# Patient Record
Sex: Female | Born: 1965 | Race: White | Hispanic: No | Smoking: Never smoker
Health system: Southern US, Community
[De-identification: ages and names within clinical notes are randomized; demographics above are authoritative.]

## PROBLEM LIST (undated history)

## (undated) DIAGNOSIS — F419 Anxiety disorder, unspecified: Secondary | ICD-10-CM

## (undated) DIAGNOSIS — R112 Nausea with vomiting, unspecified: Secondary | ICD-10-CM

## (undated) DIAGNOSIS — K219 Gastro-esophageal reflux disease without esophagitis: Secondary | ICD-10-CM

## (undated) DIAGNOSIS — Z9889 Other specified postprocedural states: Secondary | ICD-10-CM

## (undated) DIAGNOSIS — R32 Unspecified urinary incontinence: Secondary | ICD-10-CM

## (undated) DIAGNOSIS — F988 Other specified behavioral and emotional disorders with onset usually occurring in childhood and adolescence: Secondary | ICD-10-CM

## (undated) DIAGNOSIS — M1990 Unspecified osteoarthritis, unspecified site: Secondary | ICD-10-CM

## (undated) DIAGNOSIS — B009 Herpesviral infection, unspecified: Secondary | ICD-10-CM

## (undated) DIAGNOSIS — G4730 Sleep apnea, unspecified: Secondary | ICD-10-CM

## (undated) DIAGNOSIS — IMO0002 Reserved for concepts with insufficient information to code with codable children: Secondary | ICD-10-CM

## (undated) DIAGNOSIS — R51 Headache: Secondary | ICD-10-CM

## (undated) DIAGNOSIS — J302 Other seasonal allergic rhinitis: Secondary | ICD-10-CM

## (undated) DIAGNOSIS — I499 Cardiac arrhythmia, unspecified: Secondary | ICD-10-CM

## (undated) HISTORY — DX: Herpesviral infection, unspecified: B00.9

## (undated) HISTORY — PX: BACK SURGERY: SHX140

## (undated) HISTORY — DX: Unspecified urinary incontinence: R32

## (undated) HISTORY — PX: BREAST SURGERY: SHX581

## (undated) HISTORY — DX: Other specified behavioral and emotional disorders with onset usually occurring in childhood and adolescence: F98.8

## (undated) HISTORY — PX: WISDOM TOOTH EXTRACTION: SHX21

## (undated) HISTORY — DX: Reserved for concepts with insufficient information to code with codable children: IMO0002

## (undated) HISTORY — DX: Anxiety disorder, unspecified: F41.9

## (undated) HISTORY — PX: CERVICAL FUSION: SHX112

---

## 1997-07-24 ENCOUNTER — Other Ambulatory Visit: Admission: RE | Admit: 1997-07-24 | Discharge: 1997-07-24 | Payer: Self-pay | Admitting: Obstetrics and Gynecology

## 1998-01-15 ENCOUNTER — Inpatient Hospital Stay (HOSPITAL_COMMUNITY): Admission: RE | Admit: 1998-01-15 | Discharge: 1998-01-17 | Payer: Self-pay | Admitting: Obstetrics and Gynecology

## 1998-01-20 ENCOUNTER — Inpatient Hospital Stay (HOSPITAL_COMMUNITY): Admission: AD | Admit: 1998-01-20 | Discharge: 1998-01-20 | Payer: Self-pay | Admitting: Obstetrics and Gynecology

## 1998-02-05 ENCOUNTER — Encounter: Payer: Self-pay | Admitting: Obstetrics and Gynecology

## 1998-02-05 ENCOUNTER — Inpatient Hospital Stay (HOSPITAL_COMMUNITY): Admission: AD | Admit: 1998-02-05 | Discharge: 1998-02-05 | Payer: Self-pay | Admitting: Obstetrics and Gynecology

## 1999-03-01 HISTORY — PX: ABDOMINAL HYSTERECTOMY: SHX81

## 1999-12-31 ENCOUNTER — Other Ambulatory Visit: Admission: RE | Admit: 1999-12-31 | Discharge: 1999-12-31 | Payer: Self-pay | Admitting: Obstetrics and Gynecology

## 2000-02-29 HISTORY — PX: AUGMENTATION MAMMAPLASTY: SUR837

## 2001-02-06 ENCOUNTER — Ambulatory Visit (HOSPITAL_COMMUNITY): Admission: RE | Admit: 2001-02-06 | Discharge: 2001-02-06 | Payer: Self-pay | Admitting: Neurosurgery

## 2001-02-06 ENCOUNTER — Encounter: Payer: Self-pay | Admitting: Neurosurgery

## 2001-03-30 ENCOUNTER — Encounter: Payer: Self-pay | Admitting: Neurosurgery

## 2001-04-03 ENCOUNTER — Inpatient Hospital Stay (HOSPITAL_COMMUNITY): Admission: RE | Admit: 2001-04-03 | Discharge: 2001-04-04 | Payer: Self-pay | Admitting: Neurosurgery

## 2001-04-03 ENCOUNTER — Encounter: Payer: Self-pay | Admitting: Neurosurgery

## 2001-11-08 ENCOUNTER — Other Ambulatory Visit: Admission: RE | Admit: 2001-11-08 | Discharge: 2001-11-08 | Payer: Self-pay | Admitting: Obstetrics and Gynecology

## 2002-03-31 HISTORY — PX: INCONTINENCE SURGERY: SHX676

## 2002-12-26 ENCOUNTER — Ambulatory Visit (HOSPITAL_COMMUNITY): Admission: RE | Admit: 2002-12-26 | Discharge: 2002-12-26 | Payer: Self-pay | Admitting: Obstetrics and Gynecology

## 2004-04-26 ENCOUNTER — Encounter: Admission: RE | Admit: 2004-04-26 | Discharge: 2004-04-26 | Payer: Self-pay | Admitting: Gastroenterology

## 2004-08-20 ENCOUNTER — Other Ambulatory Visit: Admission: RE | Admit: 2004-08-20 | Discharge: 2004-08-20 | Payer: Self-pay | Admitting: Obstetrics and Gynecology

## 2006-01-25 ENCOUNTER — Encounter: Admission: RE | Admit: 2006-01-25 | Discharge: 2006-02-23 | Payer: Self-pay | Admitting: Obstetrics and Gynecology

## 2006-02-24 ENCOUNTER — Encounter: Admission: RE | Admit: 2006-02-24 | Discharge: 2006-03-26 | Payer: Self-pay | Admitting: Obstetrics and Gynecology

## 2006-03-27 ENCOUNTER — Encounter: Admission: RE | Admit: 2006-03-27 | Discharge: 2006-04-26 | Payer: Self-pay | Admitting: Obstetrics and Gynecology

## 2006-04-27 ENCOUNTER — Encounter: Admission: RE | Admit: 2006-04-27 | Discharge: 2006-04-28 | Payer: Self-pay | Admitting: Obstetrics and Gynecology

## 2008-12-29 ENCOUNTER — Emergency Department (HOSPITAL_COMMUNITY): Admission: EM | Admit: 2008-12-29 | Discharge: 2008-12-29 | Payer: Self-pay | Admitting: Emergency Medicine

## 2010-07-16 NOTE — Op Note (Signed)
NAMECATERIN, TABARES                             ACCOUNT NO.:  [id number]   MEDICAL RECORD NO.:  [id number]                   PATIENT TYPE:  AMB   LOCATION:  SDC                                  FACILITY:  [facility]   PHYSICIAN:  Miguel Aschoff, M.D.                    DATE OF BIRTH:  Oct 01, 1965   DATE OF PROCEDURE:  12/26/2002  DATE OF DISCHARGE:                                 OPERATIVE REPORT   PREOPERATIVE DIAGNOSIS:  Vaginal wall masses.   POSTOPERATIVE DIAGNOSIS:  Vaginal wall masses.   PROCEDURE:  Excision of vaginal wall mass of anterior and posterior vaginal  wall.   SURGEON:  Miguel Aschoff, M.D.   ANESTHESIA:  IV sedation with local block.   COMPLICATIONS:  None.   JUSTIFICATION:  The patient is a 45 year old white female who underwent  prior vaginal hysterectomy.  Following vaginal hysterectomy the patient  underwent a suburethral sling procedure and then developed a polypoid lesion  on the anterior ___________ wall in the area of the prior sling procedure.  The patient has discomfort from this area and would like it excised.  In  addition, she has the small lesion on her vulva that she would additionally  like excised.  She presents now to undergo excision of these areas.   DESCRIPTION OF PROCEDURE:  The patient was taken to the operating room and  placed in the supine position.  IV sedation was administered.  She was then  placed in the dorsal lithotomy position and prepped and draped in the usual  sterile fashion.  A speculum was placed in the vaginal vault.  On the  anterior vaginal wall was a 3 cm polypoid area of vaginal mucosa.  This area  was grasped with an Allis clamp, the base of this area injected with 1%  Xylocaine.  Then this area was excised off the anterior vaginal wall and the  defect closed using interrupted sutures of 4-0 Vicryl.  A similar-type  lesion was noted at the area of the posterior vaginal wall near the apex of  the vaginal cuff.  This area was  injected with local anesthetic and again  this additional area was excised without difficulty and this area closed  using running interlocking 4-0 Vicryl.  A third small area was noted on the  fourchette.  This appeared to be a small papilloma.  This was excised  without difficulty and did not require any suturing.  At this point the  procedure was completed, hemostasis was readily achieved, and the patient  was reversed from her anesthesia and taken to the recovery room in  satisfactory condition.  Estimated blood loss was less than 5 mL.  The plan  is for the patient to be discharged home.  Medications for home include  Darvocet-N 100 one every four hours as needed for pain.  She has been  instructed to place nothing in the vagina for two weeks, to call for any  problems such as fever, pain, or heavy bleeding.  She will be seen back in  four weeks for a follow-up examination.                                               Miguel Aschoff, M.D.    AR/MEDQ  D:  12/26/2002  T:  12/26/2002  Job:  [id number]

## 2010-07-16 NOTE — Op Note (Signed)
[place]. [facility]  Patient:    SELINE, ENZOR Visit Number: [id number] MRN: [id number]          Service Type: SUR Location: 3000 [id number] [id number] Attending Physician:  Emeterio Reeve Dictated by:   Payton Doughty, M.D. Proc. Date: 04/03/01 Admit Date:  04/03/2001 Discharge Date: 04/04/2001                             Operative Report  PREOPERATIVE DIAGNOSIS:  Herniated disk at C5-6.  POSTOPERATIVE DIAGNOSIS:  Herniated disk at C5-6.  OPERATION PERFORMED:  C5-6 anterior cervical diskectomy and fusion and tether plate.  SURGEON:  Payton Doughty, M.D.  ANESTHESIA:  General endotracheal.  PREP:  Sterile Betadine prep and scrub with alcohol wipe.  COMPLICATIONS:  None.  ASSISTANT: 1. Hewitt Shorts, M.D. 2. [place].  DESCRIPTION OF PROCEDURE:  The patient is a 45 year old right-handed white female with a right C6-7 radiculopathy and herniated disk at C5-6.  The patient was taken to the operating room, smoothly anesthetized and intubated, and placed supine on the operating table in the halter head traction. Following shave, prep and drape in the usual sterile fashion, the skin was incised in the midline, the medial border of the sternocleidomastoid at the level of the platysma.  The platysma was identified, elevated, divided and undermined.  Sternocleidomastoid was identified, medial dissection revealed the carotid artery retracted laterally to the left, trachea and esophagus retracted laterally to the right exposing the bones of the anterior cervical spine.  A marker was placed.  Intraoperative x-ray was obtained to confirm correctness of the level.  Having confirmed correctness of the level, diskectomy was carried out at C6-7 first under gross observation.  The operating microscope was then brought in and microdissection technique used to explore the anterior epidural space, resect the disk and posterior longitudinal ligament.  Both neural foramina  were carefully decompressed and it was found there was disk extruded into the right C6 neural foramen.  There was also some lateral compression in the anterior epidural space.  Following complete decompression, a 7 mm bone graft was fashioned from patellar allograft and gently tapped into the disk space.  A 14 mm tether plate was then placed with 12 mm screws, two at C5, two in C6.  The wound was irrigated and hemostasis assured.  X-ray confirmed good placement of bone graft with plate and screws.  Platysma was reapproximated with 3-0 Vicryl in interrupted fashion.  Subcutaneous tissues reapproximated with 3-0 Vicryl suture in interrupted fashion.  The skin was closed with 4-0 Vicryl in running subcuticular fashion.  Benzoin and Steri-Strips were placed and made occlusive with Telfa and OpSite.  The patient was then placed in an Aspen collar and returned to the recovery room in good condition. Dictated by:   Payton Doughty, M.D. Attending Physician:  Emeterio Reeve DD:  04/03/01 TD:  04/04/01 Job: [id number]

## 2010-07-16 NOTE — H&P (Signed)
[place]. [facility]  Patient:    Kaitlyn Carter, Kaitlyn Carter Visit Number: [id number] MRN: [id number]          Service Type: SUR Location: 3000 3038 01 Attending Physician:  Emeterio Reeve Dictated by:   Payton Doughty, M.D. Admit Date:  04/03/2001 Discharge Date: 04/04/2001                           History and Physical  ADMITTING DIAGNOSIS:  Cervical herniated disk at C5-C6 on the right side.  SERVICE:  Neurosurgery.  HISTORY OF PRESENT ILLNESS:  This is a 45 year old right-handed white female who has had some difficulty with her elbow, got an injection a year ago, did well, had recurrence of her pain, pain down her shoulder and neck, now recurrence of ______ radiating from the forearm to the first two fingers of the right hand.  Another injection of her elbow has not done much for it.  MRI demonstrated a herniated disk at 5-6 and she was referred to me.  PAST MEDICAL HISTORY:  Her medical history is remarkable for depression.  MEDICATIONS: 1. Wellbutrin 100 mg twice a day. 2. Zyrtec once a day for allergies. 3. Atenolol half a tablet, which is 50 mg a day. 4. Prednisone Dosepak, which is now over. 5. She uses Darvocet and Ultracet on a p.r.n. basis.  PAST SURGICAL HISTORY:  Surgical history is remarkable for hysterectomy, cosmetic operation, wisdom teeth and a D&C.  ALLERGIES:  She is allergic to VICODIN.  SOCIAL HISTORY:  She does not smoke, drinks only socially and stays at home.  FAMILY HISTORY:  Mom is 72, in good health with neck and back pain.  Her daddy is 30, in fair health with type 2 diabetes.  REVIEW OF SYSTEMS:  Remarkable for night sweats, chest pain, irregular pulse, leg pain, shortness of breath, UTIs, arm weakness and arm pain, neck pain, joint pain, blacking out, coordination, depression and nasal allergies.  PHYSICAL EXAMINATION:  HEENT:  Within normal limits.  NECK:  She has reasonable range of motion of her neck.  Turning her  head towards the right can reproduce her right shoulder pain.  CHEST:  Clear.  CARDIAC:  Regular rate and rhythm.  She is somewhat tachycardic.  ABDOMEN:  Nontender.  No hepatosplenomegaly.  EXTREMITIES:  Without clubbing or cyanosis.  GU:  Deferred.  PERIPHERAL PULSES:  Good.  NEUROLOGIC:  She is awake, alert and oriented.  Cranial nerves are intact. Motor exam shows 5/5 strength throughout the upper extremities, save for the right triceps, which is 4+/5.  Sensory deficit as described in her right C6 and partial C7.  Reflexes are 2 at the left biceps, 1 at the right biceps, 2 at the triceps, 2 at the brachioradialis bilaterally.  Lower extremities are non-myelopathic.  LABORATORY AND ACCESSORY DATA:  MRI shows loss of cervical lordosis at C5-6 as well as a herniated disk at that level to the right with interference of the right side of the cord and the right C5-6 neural foramen.  CLINICAL IMPRESSION:  Right C6-C7 radiculopathy related to a herniated disk at C5-C6.  She underwent epidural steroids to no avail and now presents for an anterior cervical diskectomy and fusion.  The risks and benefits of this approach have been discussed with her and she wishes to proceed. Dictated by:   Payton Doughty, M.D. Attending Physician:  Emeterio Reeve DD:  04/03/01 TD:  04/03/01 Job: [id number]  [id number]

## 2012-06-28 ENCOUNTER — Ambulatory Visit: Payer: Self-pay | Admitting: Internal Medicine

## 2012-07-03 ENCOUNTER — Telehealth: Payer: Self-pay | Admitting: Internal Medicine

## 2012-07-03 ENCOUNTER — Ambulatory Visit (INDEPENDENT_AMBULATORY_CARE_PROVIDER_SITE_OTHER): Payer: Self-pay | Admitting: Internal Medicine

## 2012-07-03 ENCOUNTER — Encounter: Payer: Self-pay | Admitting: Internal Medicine

## 2012-07-03 VITALS — BP 138/86 | HR 114 | Temp 97.5°F | Resp 18 | Ht 66.0 in | Wt 151.0 lb

## 2012-07-03 DIAGNOSIS — J309 Allergic rhinitis, unspecified: Secondary | ICD-10-CM

## 2012-07-03 DIAGNOSIS — R32 Unspecified urinary incontinence: Secondary | ICD-10-CM

## 2012-07-03 DIAGNOSIS — F4323 Adjustment disorder with mixed anxiety and depressed mood: Secondary | ICD-10-CM

## 2012-07-03 DIAGNOSIS — F329 Major depressive disorder, single episode, unspecified: Secondary | ICD-10-CM

## 2012-07-03 DIAGNOSIS — F32A Depression, unspecified: Secondary | ICD-10-CM

## 2012-07-03 DIAGNOSIS — J45909 Unspecified asthma, uncomplicated: Secondary | ICD-10-CM

## 2012-07-03 DIAGNOSIS — Z90710 Acquired absence of both cervix and uterus: Secondary | ICD-10-CM

## 2012-07-03 MED ORDER — NYSTATIN-TRIAMCINOLONE 100000-0.1 UNIT/GM-% EX CREA: TOPICAL_CREAM | Freq: Two times a day (BID) | CUTANEOUS | Status: DC

## 2012-07-03 MED ORDER — MOMETASONE FURO-FORMOTEROL FUM 100-5 MCG/ACT IN AERO: INHALATION_SPRAY | RESPIRATORY_TRACT | Status: DC

## 2012-07-03 MED ORDER — FLUCONAZOLE 150 MG PO TABS: 150.0000 mg | ORAL_TABLET | Freq: Once | ORAL | Status: DC

## 2012-07-03 MED ORDER — SERTRALINE HCL 50 MG PO TABS: ORAL_TABLET | ORAL | Status: DC

## 2012-07-03 NOTE — Patient Instructions (Addendum)
Use Dulera 2 inhalations twice a day  Take diflucan pill and use creme twice a day externally  [place] allergy  Dr. Unity Callas or Dr. Barnetta Chapel.  Will set up referral to Dr. Edward Jolly  urogynecologist  See me in 7-10 days

## 2012-07-03 NOTE — Progress Notes (Signed)
Subjective:    Patient ID: Kaitlyn Carter, female    DOB: [birth year], 47 y.o.   MRN: [id number]  HPI  Kaitlyn Carter is a new pt here to establish care.   She is the daughter of Arlys John a pt of mine.    Former care at [facility].  PMH of anxiety/depression, urinary incontinence S/P urethral sling, and S/p Hysterectomy  She is concerned about several issues.  Since her mesh sling surgery (Dr. Pete Glatter 2004) she is still having problems with stress and at times urge incontinence. Of note she also states that her husband reports that during intercourse he "Feels like he is hitting something" and pt states he does have irritation on foreskin after intercourse.   She would like to discuss with urogynecologist.    She also has lots of situational stress right now.   I note she is on low dose Zoloft 50 mg.  She tells me her husband was emotionally unfaithful but he was not involved in a physical relationship.   She has had church counseling but has significant trust issues with her spouse.  She does not have a therapist as yet.  She also has been treated for bronchits with Levofloxacin and two courses of prednisone.   She has had lots of problems with allergies this year,  She was wheezing and SOB.  She did see an ENT Dr. Ezzard Standing who did not find active infection.   She did have a CXR which by her report was negative  (done at [facility] - I so not have report)  She stopped both Levofloxacin and Prednisone yesterday.  She states she is about 50-60% improved.  Cough much better , no SOB now , no wheezing.  She has never been allergy tested but she does feel itchy after being with horses at her home and certain food cause her uvula to swell per her report.    Pt feels like she has a yeast infection after antibiotics and prednisone.  Lots of external itching and red skin  Review of Systems    see HPI Objective:   Physical Exam  Physical Exam  Nursing note and vitals reviewed.   Peak  flow in office today  350 Constitutional: She is oriented to person, place, and time. She appears well-developed and well-nourished.  HENT:  Head: Normocephalic and atraumatic.  Cardiovascular: Normal rate and regular rhythm. Exam reveals no gallop and no friction rub.  No murmur heard.  Pulmonary/Chest: Breath sounds normal. She has no wheezes. She has no rales.   Good air flow  Neurological: She is alert and oriented to person, place, and time.  Skin: Skin is warm and dry.  Psychiatric: She has a normal mood and affect. Her behavior is normal.   Tearful when discussing marital issues       Assessment & Plan:  Urinary incontinence/  S/P mesh sling.  She would like an opinion of a urogynecologist .  Will refer to Dr. Edward Jolly. ??? Mesh erosion  Probable asthmatic bronchitis  Peak flow very good today.  Will change her Flovent to [facility] to take 2 inhalations bid for the next two months.  She is off prednisone now.  Will get old records.  She can use albuterol prn.  Will get labs today  Allergic rhinitis  Based on history there is likely strong external allergy component  I gave pt number for [place] allergy to be evaluated.  Situational anxiety/depression  Marital discord  .  Will refer  her for marital therapy.  Ok to try to increase Zoloft to 100 mg nightly but strong situational component to her current emotional condition.   Probable post antibiotic/prednisone yeast vaginitis  OK for empiric Diflucan and mycology externally for now  She is to see me in one week

## 2012-07-03 NOTE — Telephone Encounter (Signed)
Pt scheduled with Dr Wyvonnia Lora Friday Jul 13, 2012 at 9:15 am.  Pt made aware of appt, date, time, & location.Brayton Caves

## 2012-07-04 LAB — LIPID PANEL
Cholesterol: 174 mg/dL (ref 0–200)
HDL: 70 mg/dL (ref >39)
Total CHOL/HDL Ratio: 2.5 Ratio
VLDL: 33 mg/dL (ref 0–40)

## 2012-07-04 LAB — COMPREHENSIVE METABOLIC PANEL
ALT: 13 U/L (ref 0–35)
Alkaline Phosphatase: 122 U/L — ABNORMAL HIGH (ref 39–117)
Creat: 0.73 mg/dL (ref 0.50–1.10)
Sodium: 140 mEq/L (ref 135–145)
Total Bilirubin: 0.6 mg/dL (ref 0.3–1.2)
Total Protein: 7.7 g/dL (ref 6.0–8.3)

## 2012-07-04 LAB — CBC WITH DIFFERENTIAL/PLATELET
Basophils Relative: 0 % (ref 0–1)
Eosinophils Absolute: 0.2 10*3/uL (ref 0.0–0.7)
Eosinophils Relative: 1 % (ref 0–5)
MCH: 30.2 pg (ref 26.0–34.0)
MCHC: 33.5 g/dL (ref 30.0–36.0)
MCV: 90.1 fL (ref 78.0–100.0)
Neutrophils Relative %: 62 % (ref 43–77)
Platelets: 345 10*3/uL (ref 150–400)
RDW: 14.4 % (ref 11.5–15.5)

## 2012-07-04 LAB — TSH: TSH: 2.152 u[IU]/mL (ref 0.350–4.500)

## 2012-07-10 ENCOUNTER — Telehealth: Payer: Self-pay | Admitting: *Deleted

## 2012-07-10 ENCOUNTER — Ambulatory Visit: Payer: BC Managed Care – PPO | Admitting: Internal Medicine

## 2012-07-10 NOTE — Telephone Encounter (Signed)
Prior authorization form faxed back for [facility]

## 2012-07-10 NOTE — Telephone Encounter (Signed)
See comments in call comments

## 2012-07-13 ENCOUNTER — Ambulatory Visit (INDEPENDENT_AMBULATORY_CARE_PROVIDER_SITE_OTHER): Payer: BC Managed Care – PPO | Admitting: Obstetrics and Gynecology

## 2012-07-13 ENCOUNTER — Encounter: Payer: Self-pay | Admitting: Obstetrics and Gynecology

## 2012-07-13 VITALS — BP 120/78 | Ht 65.0 in | Wt 154.0 lb

## 2012-07-13 DIAGNOSIS — N952 Postmenopausal atrophic vaginitis: Secondary | ICD-10-CM

## 2012-07-13 DIAGNOSIS — Z01419 Encounter for gynecological examination (general) (routine) without abnormal findings: Secondary | ICD-10-CM

## 2012-07-13 DIAGNOSIS — N5312 Painful ejaculation: Secondary | ICD-10-CM

## 2012-07-13 DIAGNOSIS — N393 Stress incontinence (female) (male): Secondary | ICD-10-CM

## 2012-07-13 DIAGNOSIS — R1032 Left lower quadrant pain: Secondary | ICD-10-CM

## 2012-07-13 MED ORDER — ESTRADIOL 0.1 MG/GM VA CREA: TOPICAL_CREAM | VAGINAL | Status: DC

## 2012-07-13 NOTE — Patient Instructions (Addendum)
Conjugated Estrogens vaginal cream What is this medicine? CONJUGATED ESTROGENS (CON ju gate ed ESS troe jenz) are a mixture of female hormones. This cream can help relieve symptoms associated with menopause.like vaginal dryness and irritation. This medicine may be used for other purposes; ask your health care provider or pharmacist if you have questions. What should I tell my health care provider before I take this medicine? They need to know if you have any of these conditions: -abnormal vaginal bleeding -blood vessel disease or blood clots -breast, cervical, endometrial, or uterine cancer -dementia -diabetes -gallbladder disease -heart disease or recent heart attack -high blood pressure -high cholesterol -high level of calcium in the blood -hysterectomy -kidney disease -liver disease -migraine headaches -protein C deficiency -protein S deficiency -stroke -systemic lupus erythematosus (SLE) -tobacco smoker -an unusual or allergic reaction to estrogens other medicines, foods, dyes, or preservatives -pregnant or trying to get pregnant -breast-feeding How should I use this medicine? This medicine is for use in the vagina only. Do not take by mouth. Follow the directions on the prescription label. Use at bedtime unless otherwise directed by your doctor or health care professional. Use the special applicator supplied with the cream. Wash hands before and after use. Fill the applicator with the cream and remove from the tube. Lie on your back, part and bend your knees. Insert the applicator into the vagina and push the plunger to expel the cream into the vagina. Wash the applicator with warm soapy water and rinse well. Use exactly as directed for the complete length of time prescribed. Do not stop using except on the advice of your doctor or health care professional. Talk to your pediatrician regarding the use of this medicine in children. Special care may be needed. A patient package  insert for the product will be given with each prescription and refill. Read this sheet carefully each time. The sheet may change frequently. Overdosage: If you think you have taken too much of this medicine contact a poison control center or emergency room at once. NOTE: This medicine is only for you. Do not share this medicine with others. What if I miss a dose? If you miss a dose, use it as soon as you can. If it is almost time for your next dose, use only that dose. Do not use double or extra doses. What may interact with this medicine? Do not take this medicine with any of the following medications: -aromatase inhibitors like aminoglutethimide, anastrozole, exemestane, letrozole, testolactone This medicine may also interact with the following medications: -barbiturates used for inducing sleep or treating seizures -carbamazepine -grapefruit juice -medicines for fungal infections like itraconazole and ketoconazole -raloxifene or tamoxifen -rifabutin -rifampin -rifapentine -ritonavir -some antibiotics used to treat infections -St. John's Wort -warfarin This list may not describe all possible interactions. Give your health care provider a list of all the medicines, herbs, non-prescription drugs, or dietary supplements you use. Also tell them if you smoke, drink alcohol, or use illegal drugs. Some items may interact with your medicine. What should I watch for while using this medicine? Visit your health care professional for regular checks on your progress. You will need a regular breast and pelvic exam. You should also discuss the need for regular mammograms with your health care professional, and follow his or her guidelines. This medicine can make your body retain fluid, making your fingers, hands, or ankles swell. Your blood pressure can go up. Contact your doctor or health care professional if you feel you are retaining   fluid. If you have any reason to think you are pregnant; stop  taking this medicine at once and contact your doctor or health care professional. Tobacco smoking increases the risk of getting a blood clot or having a stroke, especially if you are more than 45 years old. You are strongly advised not to smoke. If you wear contact lenses and notice visual changes, or if the lenses begin to feel uncomfortable, consult your eye care specialist. If you are going to have elective surgery, you may need to stop taking this medicine beforehand. Consult your health care professional for advice prior to scheduling the surgery. What side effects may I notice from receiving this medicine? Side effects that you should report to your doctor or health care professional as soon as possible: -allergic reactions like skin rash, itching or hives, swelling of the face, lips, or tongue -breast tissue changes or discharge -changes in vision -chest pain -confusion, trouble speaking or understanding -dark urine -general ill feeling or flu-like symptoms -light-colored stools -nausea, vomiting -pain, swelling, warmth in the leg -right upper belly pain -severe headaches -shortness of breath -sudden numbness or weakness of the face, arm or leg -trouble walking, dizziness, loss of balance or coordination -unusual vaginal bleeding -yellowing of the eyes or skin Side effects that usually do not require medical attention (report to your doctor or health care professional if they continue or are bothersome): -hair loss -increased hunger or thirst -increased urination -symptoms of vaginal infection like itching, irritation or unusual discharge -unusually weak or tired This list may not describe all possible side effects. Call your doctor for medical advice about side effects. You may report side effects to FDA at 1-800-FDA-1088. Where should I keep my medicine? Keep out of the reach of children. Store at room temperature between 15 and 30 degrees C (59 and 86 degrees F). Throw away  any unused medicine after the expiration date. NOTE: This sheet is a summary. It may not cover all possible information. If you have questions about this medicine, talk to your doctor, pharmacist, or health care provider.  2012, [employer]. (05/19/2010 9:20:36 AM) 

## 2012-07-13 NOTE — Progress Notes (Signed)
Patient ID: Kaitlyn Carter, female   DOB: 1965/09/17, 47 y.o.   MRN: [id number]  47 y.o.  G53P0  Caucasian  female status post vaginal hysterectomy with anterior and posterior colporrhaphy in 2000 or 2001 by Dr. Miguel Aschoff of [facility], who is now referred by Dr. Constance Goltz for recurrent urinary incontinence.    Status post [facility] sling in 04/08/02 by Dr. Pete Glatter, urologist in [facility]. Status post excision of vaginal mucosal scar tissue removed by Dr. Miguel Aschoff 6 months after sling was performed.  Prior to midurethral sling, patient had urinary incontinence with sneezing, coughing, intercourse.  Patient states she never got relief of her symptoms. Wearing Poise pads.  Did not return to her urologist for further care.  Post operatively, patient has had recurrent UTIs.  Takes Macrobid for prophylaxis with intercourse.  If she does not take Macrobid, has a monthly UTI.     Does not feel like she is emptying completely.  Having urgency and frequency.  DF is every hour.  NF twice night.  No eneursis.  No blood in urine.  No dysuria.  Notes left lower quadrant pain at times.  Husband having penile pain and keloid from intercourse.  This is new onset for the last 10 months.  Husband is long time partner.  Patient now feeling a vaginal bulge.  Worried about the vaginal size being too large.  No history of nephrolithiasis.    Difficulty having bowel movements.  Feels bowel movements are more loose than firm.  Does splinting to have bowel movements.  Has had fecal incontinence related to intercourse.   Had colonoscopy 2004. 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    Currently battling bronchitis symptoms for the last 6 weeks.  Will see an allergy specialist.  Her bronchitis symptoms make her incontinence worse.   No LMP recorded. Patient has had a hysterectomy.          Sexually active: yes  The current method of family planning is status post hysterectomy.    Last mammogram:  2012 in [place]. Last pap smear: does  not remember.   History of abnormal pap: no.  Hormonal therapy:  none No complete GYN exam in 2 years.   Family History  Problem Relation Age of Onset  . Depression Mother   . Asthma Mother   . Diabetes Father   . Hypertension Father   . Heart disease Father   . Kidney disease Father   . Breast cancer Maternal Grandmother   . Hypertension Maternal Grandmother   . Hypertension Maternal Grandfather   . Stroke Maternal Grandfather   . Hypertension Paternal Grandfather   . Stroke Paternal Grandfather     Patient Active Problem List   Diagnosis Date Noted  . Adjustment disorder with mixed anxiety and depressed mood 07/03/2012  . Urinary incontinence 07/03/2012  . Asthmatic bronchitis 07/03/2012  . Allergic rhinitis 07/03/2012  . S/P hysterectomy 07/03/2012    Past Medical History  Diagnosis Date  . Attention deficit disorder   . Anxiety   . Dyspareunia   . Urinary incontinence     Past Surgical History  Procedure Laterality Date  . Incontinence surgery  03/2002    [facility] sling--Dr. Di Kindle in  [facility]  . Abdominal hysterectomy  2001    [facility]-Dr. Miguel Aschoff  . Breast surgery    . Augmentation mammaplasty  2002    Allergies: Review of patient's allergies indicates no known allergies.  Current Outpatient Prescriptions  Medication Sig Dispense Refill  .  albuterol (PROVENTIL HFA;VENTOLIN HFA) 108 (90 BASE) MCG/ACT inhaler Inhale 2 puffs into the lungs every 6 (six) hours as needed for wheezing.      Marland Kitchen amphetamine-dextroamphetamine (ADDERALL) 10 MG tablet Take 10 mg by mouth daily.      . fluticasone (FLOVENT HFA) 220 MCG/ACT inhaler Inhale 1 puff into the lungs 2 (two) times daily.      . mometasone-formoterol (DULERA) 100-5 MCG/ACT AERO Take 2 inhalations twice a day  1 Inhaler  1  . sertraline (ZOLOFT) 50 MG tablet Take 2 tablets daily  60 tablet  1  . fluconazole (DIFLUCAN) 150 MG tablet Take 1 tablet (150 mg total) by mouth once.  1 tablet  0  .  nystatin-triamcinolone (MYCOLOG II) cream Apply topically 2 (two) times daily.  30 g  0   No current facility-administered medications for this visit.    ROS: Pertinent items are noted in HPI.  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  Exam:    BP 120/78  Ht 5\' 5"  (1.651 m)  Wt 154 lb (69.854 kg)  BMI 25.63 kg/m2   Wt Readings from Last 3 Encounters:  07/13/12 154 lb (69.854 kg)  07/03/12 151 lb (68.493 kg)     Ht Readings from Last 3 Encounters:  07/13/12 5\' 5"  (1.651 m)  07/03/12 5\' 6"  (1.676 m)    General appearance: alert, cooperative and appears stated age Abdomen: soft, non-tender; bowel sounds normal; no masses,  no organomegaly  Pelvic: External genitalia:  no lesions              Urethra:  normal appearing urethra with no masses, tenderness or lesions              Bartholins and Skenes: normal                 Vagina: normal appearing vagina with normal color and discharge, no lesions.  There is a minimal cystocele.  There is no visible or directly palpable synthetic sling.  With careful bimanual exam, I can identify the location of the sling which seems to be closer to the bladder and urethrovesical junction than the midurethra.                Cervix: absent                      Bimanual Exam:  Uterus:  Absent.                                      Adnexa: normal adnexa in size, nontender and no masses                                      Rectovaginal: Confirms                                      Anus:  normal sphincter tone, no lesions  Urine dip:  negative  Op report reviewed from [facility] sling - 04/08/02 - Dr. Di Kindle, [facility] - left cystotomy occurred and left needle was corrected.  Left vaginal side of sling exited lateral to the vaginal incision and a flap was created to cover over the sling.  Assessment:  Genuine stress incontinence.  I doubt any fistula formation. Female dyspareunia. Atrophic vagina. LLQ pain. Overdue  for mammogram and preventative GYN exam. Fecal incontinence.   Plan:    Patient will need a comprehensive evaluation of her incontinence including multichannel urodynamic studies and cystoscopy.   She may benefit from a future repeat tension free sling, a pubovaginal sling, or urethral bulking agent therapy depending on the results of her evaluation. As the dyspareunia is recent in nature, I believe it is due to atrophic vaginal changes with perimenopause.  I have therefore prescribed vaginal Estrace cream 1/2 gram nightly for 2 weeks and then twice weekly. If dyspareunia is persistent despite estrogen therapy, the old sling may need excision.  Gave the patient the name of [facility] for her husband to see. [facility] We discussed Metamucil as a way to improve her bowel function including her occasional fecal incontinence. Patient may also benefit from a pelvic ultrasound to evaluate the LLQ pain. Follow up visit in approximately 2 weeks.  An After Visit Summary was printed and given to the patient.

## 2012-07-15 ENCOUNTER — Telehealth: Payer: Self-pay | Admitting: Internal Medicine

## 2012-07-15 ENCOUNTER — Encounter: Payer: Self-pay | Admitting: Internal Medicine

## 2012-07-15 DIAGNOSIS — M5020 Other cervical disc displacement, unspecified cervical region: Secondary | ICD-10-CM | POA: Insufficient documentation

## 2012-07-15 NOTE — Telephone Encounter (Signed)
Bobbie  Call Maely and see if she can come in to see me this week in office.   Give her a 30 min appt.    Message back with date  Thanks

## 2012-07-16 NOTE — Telephone Encounter (Signed)
LVM regarding appt.

## 2012-07-19 ENCOUNTER — Ambulatory Visit (INDEPENDENT_AMBULATORY_CARE_PROVIDER_SITE_OTHER): Payer: BC Managed Care – PPO | Admitting: Internal Medicine

## 2012-07-19 ENCOUNTER — Telehealth: Payer: Self-pay | Admitting: Internal Medicine

## 2012-07-19 ENCOUNTER — Encounter: Payer: Self-pay | Admitting: Internal Medicine

## 2012-07-19 DIAGNOSIS — R0683 Snoring: Secondary | ICD-10-CM

## 2012-07-19 DIAGNOSIS — R0989 Other specified symptoms and signs involving the circulatory and respiratory systems: Secondary | ICD-10-CM

## 2012-07-19 DIAGNOSIS — F4323 Adjustment disorder with mixed anxiety and depressed mood: Secondary | ICD-10-CM

## 2012-07-19 DIAGNOSIS — R32 Unspecified urinary incontinence: Secondary | ICD-10-CM

## 2012-07-19 DIAGNOSIS — J309 Allergic rhinitis, unspecified: Secondary | ICD-10-CM

## 2012-07-19 DIAGNOSIS — J45909 Unspecified asthma, uncomplicated: Secondary | ICD-10-CM

## 2012-07-19 LAB — COMPREHENSIVE METABOLIC PANEL
ALT: 19 U/L (ref 0–35)
AST: 20 U/L (ref 0–37)
Chloride: 104 mEq/L (ref 96–112)
Creat: 0.83 mg/dL (ref 0.50–1.10)
Sodium: 140 mEq/L (ref 135–145)
Total Bilirubin: 0.3 mg/dL (ref 0.3–1.2)
Total Protein: 7.2 g/dL (ref 6.0–8.3)

## 2012-07-19 NOTE — Patient Instructions (Addendum)
Vonna Kotyk:  [phone]  Jeannie Done:   [phone]

## 2012-07-19 NOTE — Telephone Encounter (Signed)
Pt scheduled with Dr Vassie Loll on Tuesday June 10,2014 at 3:15 pm.  Pt made aware of appt, date, time, & location by phone.Kaitlyn Carter

## 2012-07-19 NOTE — Progress Notes (Signed)
Subjective:    Patient ID: Kaitlyn Carter, female    DOB: [birth year], 47 y.o.   MRN: [id number]  HPI Kaitlyn Carter is here for follow up.    She reports mood is better on Zoloft 100 mg.  Less anxious and crying is less  She has seen Dr. Edward Jolly and is in work up for stress incontinence  See labs calcium slightly elevated.   She reports today that she is snoring frequently and family member states pt "stops breathing" at night.  She has not needed rescue inhaler for wheezing.  Insurance did not cover her Dulera . She has not been using Flovent regularly  See pulse rate  Pt states she has always had a rapid heart rate .  NO SOB or dizziness  Allergies  Allergen Reactions  . Vicodin (Hydrocodone-Acetaminophen)    Past Medical History  Diagnosis Date  . Attention deficit disorder   . Anxiety   . Dyspareunia   . Urinary incontinence    Past Surgical History  Procedure Laterality Date  . Incontinence surgery  03/2002    [facility] sling--Dr. Di Kindle in  [facility]  . Abdominal hysterectomy  2001    [facility]-Dr. Miguel Aschoff  . Breast surgery    . Augmentation mammaplasty  2002   History   Social History  . Marital Status: Single    Spouse Name: N/A    Number of Children: N/A  . Years of Education: N/A   Occupational History  . Not on file.   Social History Main Topics  . Smoking status: Never Smoker   . Smokeless tobacco: Never Used  . Alcohol Use: Yes     Comment: socially  . Drug Use: No  . Sexually Active: Yes    Birth Control/ Protection: Surgical     Comment: [facility] 2001   Other Topics Concern  . Not on file   Social History Narrative  . No narrative on file   Family History  Problem Relation Age of Onset  . Depression Mother   . Asthma Mother   . Diabetes Father   . Hypertension Father   . Heart disease Father   . Kidney disease Father   . Breast cancer Maternal Grandmother   . Hypertension Maternal Grandmother   . Hypertension Maternal Grandfather   . Stroke  Maternal Grandfather   . Hypertension Paternal Grandfather   . Stroke Paternal Grandfather    Patient Active Problem List   Diagnosis Date Noted  . Snoring 07/19/2012  . Hypercalcemia 07/19/2012  . 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 Adjustment disorder with mixed anxiety and depressed mood 07/03/2012  . Urinary incontinence 07/03/2012  . Asthmatic bronchitis 07/03/2012  . Allergic rhinitis 07/03/2012  . S/P hysterectomy 07/03/2012   Current Outpatient Prescriptions on File Prior to Visit  Medication Sig Dispense Refill  . albuterol (PROVENTIL HFA;VENTOLIN HFA) 108 (90 BASE) MCG/ACT inhaler Inhale 2 puffs into the lungs every 6 (six) hours as needed for wheezing.      Marland Kitchen estradiol (ESTRACE) 0.1 MG/GM vaginal cream Use 1/2 g vaginally every night for the first 2 weeks, then use 1/2 g vaginally two or three times per week as needed to maintain symptom relief.  42.5 g  0  . fluticasone (FLOVENT HFA) 220 MCG/ACT inhaler Inhale 1 puff into the lungs 2 (two) times daily.      . sertraline (ZOLOFT) 50 MG tablet Take 2 tablets daily  60 tablet  1  . amphetamine-dextroamphetamine (ADDERALL) 10 MG tablet  Take 10 mg by mouth daily.       No current facility-administered medications on file prior to visit.       Review of Systems See HPI    Objective:   Physical Exam Physical Exam  Nursing note and vitals reviewed.  Peak flow  340 in office Constitutional: She is oriented to person, place, and time. She appears well-developed and well-nourished.  HENT:  Head: Normocephalic and atraumatic.  Cardiovascular: She is tacycardic rate 114-120 and regular rhythm. Exam reveals no gallop and no friction rub.  No murmur heard.  Pulmonary/Chest: Breath sounds normal. She has no wheezes. She has no rales.  Neurological: She is alert and oriented to person, place, and time.  Skin: Skin is warm and dry.  Psychiatric: She has a normal mood and affect. Her behavior is normal.              Assessment & Plan:  Asthmatic bronchitis   She is off prednisone now Advised to use flovent bid for 4 weeks and I will recheck her in office then and evaluate for discontinuance.  Peak flow good today  Adjustment disorder:  willl continue zoloft 100 mg daily.  I gave phone number to Vonna Kotyk and Jeannie Done and pt is to call one for counseling.    Snoring  ?? OSA  Will refer to pulmonology for sleep study   Tachycardia  Etiology unclear now.  She tells me she is off her Adderall for the past several days.  THS normal    Minimal elevation calcium will rehceck today with PTH  Urinary incontinence  Under evaluation  Dr. Edward Jolly  See me in 4 weeks

## 2012-07-20 LAB — CBC WITH DIFFERENTIAL/PLATELET
Basophils Absolute: 0 10*3/uL (ref 0.0–0.1)
Eosinophils Absolute: 0.1 10*3/uL (ref 0.0–0.7)
Eosinophils Relative: 1 % (ref 0–5)
Lymphocytes Relative: 29 % (ref 12–46)
MCV: 88.5 fL (ref 78.0–100.0)
Neutrophils Relative %: 63 % (ref 43–77)
Platelets: 358 10*3/uL (ref 150–400)
RDW: 14.9 % (ref 11.5–15.5)
WBC: 10.5 10*3/uL (ref 4.0–10.5)

## 2012-07-24 ENCOUNTER — Encounter: Payer: Self-pay | Admitting: *Deleted

## 2012-07-25 ENCOUNTER — Telehealth: Payer: Self-pay | Admitting: *Deleted

## 2012-07-25 NOTE — Telephone Encounter (Signed)
Labs mailed to pt home address.

## 2012-07-26 ENCOUNTER — Ambulatory Visit (INDEPENDENT_AMBULATORY_CARE_PROVIDER_SITE_OTHER): Payer: BC Managed Care – PPO | Admitting: Obstetrics and Gynecology

## 2012-07-26 ENCOUNTER — Encounter: Payer: Self-pay | Admitting: Obstetrics and Gynecology

## 2012-07-26 VITALS — BP 120/76 | Ht 65.0 in | Wt 156.0 lb

## 2012-07-26 DIAGNOSIS — N5312 Painful ejaculation: Secondary | ICD-10-CM

## 2012-07-26 DIAGNOSIS — Z01419 Encounter for gynecological examination (general) (routine) without abnormal findings: Secondary | ICD-10-CM

## 2012-07-26 DIAGNOSIS — Z0000 Encounter for general adult medical examination without abnormal findings: Secondary | ICD-10-CM

## 2012-07-26 DIAGNOSIS — R1032 Left lower quadrant pain: Secondary | ICD-10-CM

## 2012-07-26 LAB — POCT URINALYSIS DIPSTICK
Glucose, UA: NEGATIVE
Ketones, UA: NEGATIVE

## 2012-07-26 NOTE — Patient Instructions (Signed)

## 2012-07-26 NOTE — Progress Notes (Signed)
Patient ID: Kaitlyn Carter, female   DOB: 01-18-1966, 47 y.o.   MRN: [id number] 47 y.o.   Single    Caucasian   female   G3P0   here for annual exam.   Having  leg cramps for three weeks at night.  Wakes patient up at night.  No swelling of leg. Patient wants to proceed with evaluation for urinary incontinence. Still having LLQ pain every month that comes and goes over a few days.  Takes Ibuprofen.  Not related to bowel movement. Using vaginal estrogen cream, still in the first two weeks of use. Has not had intercourse yet. States discomfort that her husband is experiencing is 360 degrees around the penis. Fecal incontinence occurs sometimes with intercourse.  Not consistent.  No tenesmus.  Has not tried Metamucil.    No LMP recorded. Patient has had a hysterectomy.          Sexually active: yes  The current method of family planning is status post hysterectomy.    Exercising: walking and weights Last mammogram:  2012 wnl: [facility].  Mammogram scheduled for next week at [facility]. Last pap smear: 2012 wnl History of abnormal pap: no Smoking: no Alcohol: socially Last colonoscopy: 10 years ago: Dx'd with IBS Last Bone Density:  never Last tetanus shot: 2013 Last cholesterol check: 06/2012 wnl  Hgb: PCP             Urine: Neg   Family History  Problem Relation Age of Onset  . Depression Mother   . Asthma Mother   . Diabetes Father   . Hypertension Father   . Heart disease Father   . Kidney disease Father   . Breast cancer Maternal Grandmother   . Hypertension Maternal Grandmother   . Hypertension Maternal Grandfather   . Stroke Maternal Grandfather   . Hypertension Paternal Grandfather   . Stroke Paternal Grandfather     Patient Active Problem List   Diagnosis Date Noted  . Snoring 07/19/2012  . Hypercalcemia 07/19/2012  . 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 Adjustment disorder with mixed anxiety and depressed mood 07/03/2012  . Urinary  incontinence 07/03/2012  . Asthmatic bronchitis 07/03/2012  . Allergic rhinitis 07/03/2012  . S/P hysterectomy 07/03/2012    Past Medical History  Diagnosis Date  . Attention deficit disorder   . Anxiety   . Dyspareunia   . Urinary incontinence     Past Surgical History  Procedure Laterality Date  . Incontinence surgery  03/2002    [facility] sling--Dr. Di Kindle in  [facility]  . Abdominal hysterectomy  2001    [facility]-Dr. Miguel Aschoff  . Breast surgery    . Augmentation mammaplasty  2002    Allergies: Vicodin  Current Outpatient Prescriptions  Medication Sig Dispense Refill  . albuterol (PROVENTIL HFA;VENTOLIN HFA) 108 (90 BASE) MCG/ACT inhaler Inhale 2 puffs into the lungs every 6 (six) hours as needed for wheezing.      Marland Kitchen amphetamine-dextroamphetamine (ADDERALL) 10 MG tablet Take 10 mg by mouth daily.      . cetirizine (ZYRTEC) 10 MG tablet Take 10 mg by mouth daily.      . cholecalciferol (VITAMIN D) 1000 UNITS tablet Take 1,000 Units by mouth daily. Pt takes 2000 units daily      . estradiol (ESTRACE) 0.1 MG/GM vaginal cream Use 1/2 g vaginally every night for the first 2 weeks, then use 1/2 g vaginally two or three times per week as needed to maintain symptom  relief.  42.5 g  0  . fluticasone (FLOVENT HFA) 220 MCG/ACT inhaler Inhale 1 puff into the lungs 2 (two) times daily.      . sertraline (ZOLOFT) 50 MG tablet Take 2 tablets daily  60 tablet  1   No current facility-administered medications for this visit.    ROS: Pertinent items are noted in HPI.  Social Hx:  Married.  Homemaker.  Exam:    BP 120/76  Ht 5\' 5"  (1.651 m)  Wt 156 lb (70.761 kg)  BMI 25.96 kg/m2   Wt Readings from Last 3 Encounters:  07/26/12 156 lb (70.761 kg)  07/19/12 154 lb (69.854 kg)  07/13/12 154 lb (69.854 kg)     Ht Readings from Last 3 Encounters:  07/26/12 5\' 5"  (1.651 m)  07/13/12 5\' 5"  (1.651 m)  07/03/12 5\' 6"  (1.676 m)    General appearance: alert, cooperative and appears  stated age Head: Normocephalic, without obvious abnormality, atraumatic Neck: no adenopathy, supple, symmetrical, trachea midline and thyroid not enlarged, symmetric, no tenderness/mass/nodules Lungs: clear to auscultation bilaterally Breasts: Inspection negative, No nipple retraction or dimpling, No nipple discharge or bleeding, No axillary or supraclavicular adenopathy, Normal to palpation without dominant masses. Consistent with bilateral implants. Heart: regular rate and rhythm Abdomen: soft, non-tender;   no masses,  no organomegaly Extremities: extremities normal, atraumatic, no cyanosis or edema Skin: Skin color, texture, turgor normal. No rashes or lesions Lymph nodes: Cervical, supraclavicular, and axillary nodes normal. No abnormal inguinal nodes palpated Neurologic: Grossly normal   Pelvic: External genitalia:  no lesions              Urethra:  normal appearing urethra with no masses, tenderness or lesions              Bartholins and Skenes: normal                 Vagina: normal appearing vagina with normal color and discharge, no lesions.  I can feel the location of the sling along the mid-distal urethra and the mucosa feels somewhat thin over this area.  I also detect some scar tissue over the urethral vesical junction area.                Cervix:  absent              Pap taken: no        Bimanual Exam:  Uterus:  uterus is  Absent.                                      Adnexa: normal adnexa in size, nontender and no masses, nontender                                      Rectovaginal: Confirms                                      Anus:  normal sphincter tone, no lesions Q tip test with Xylocaine jelly 2% on rayon Q tip - started with a negative deflection of minus 30 to plus 30 with valsalva.  A:  Status post hysterectomy Persistent genuine stress incontinence with hypermobility Status post [facility] sling Atrophic vaginal changes. Fecal incontinence during intercourse.  LLQ  pain  P: mammogram next week pap smear not needed return  For urodynamic testing and pelvic ultrasound I also recommend cystoscopy Continue with vaginal estrogen cream 1/2 gram nightly twice a week after completing two weeks of treatment   An After Visit Summary was printed and given to the patient.

## 2012-08-02 ENCOUNTER — Ambulatory Visit
Admission: RE | Admit: 2012-08-02 | Discharge: 2012-08-02 | Disposition: A | Discharge status: Home / Self Care | Payer: BC Managed Care – PPO | Source: Ambulatory Visit | Attending: Obstetrics and Gynecology | Admitting: Obstetrics and Gynecology

## 2012-08-02 DIAGNOSIS — Z01419 Encounter for gynecological examination (general) (routine) without abnormal findings: Secondary | ICD-10-CM

## 2012-08-07 ENCOUNTER — Encounter: Payer: Self-pay | Admitting: Pulmonary Disease

## 2012-08-07 ENCOUNTER — Ambulatory Visit (INDEPENDENT_AMBULATORY_CARE_PROVIDER_SITE_OTHER): Payer: BC Managed Care – PPO | Admitting: Pulmonary Disease

## 2012-08-07 VITALS — BP 104/76 | HR 101 | Temp 98.3°F | Ht 65.0 in | Wt 155.0 lb

## 2012-08-07 DIAGNOSIS — R0609 Other forms of dyspnea: Secondary | ICD-10-CM

## 2012-08-07 DIAGNOSIS — J309 Allergic rhinitis, unspecified: Secondary | ICD-10-CM

## 2012-08-07 DIAGNOSIS — R0683 Snoring: Secondary | ICD-10-CM

## 2012-08-07 NOTE — Assessment & Plan Note (Signed)
Chk RAST for completion

## 2012-08-07 NOTE — Patient Instructions (Signed)
Home sleep study Trial of MELATONIN 3-5 mg for sleep onset , take 2hr prior to bedtime Allergy blood work

## 2012-08-07 NOTE — Progress Notes (Signed)
Subjective:    Patient ID: Kaitlyn Carter, female    DOB: 1965/06/08, 47 y.o.   MRN: [id number]  HPI  47 year old woman referred for evaluation of sleep apnea. She had an extended bout of Sino bronchitis requiring prednisone and antibiotics for 6-8 weeks. Epworth sleepiness score is 4. She denies sleepiness in various situations such as watching TV sitting inactive in a public place or as a passenger in a car. Her main concern his that her daughter has recently observed that she stops breathing in her sleep and mild snoring. Sleeps on her side or her stomach with one pillow. Bedtime is around 10:30 to 11 PM, sleep latency can daily from 30 minutes to 2 hours. She has several awakenings but is able to go back to sleep and denies any post void sleep latency. She is out of bed at 6:30 AM feeling tired but denies dryness of mouth or headaches. There is no history suggestive of cataplexy, sleep paralysis or parasomnias She has been diagnosed with attention deficit disorder but is not on any medication for this. She denies daytime  Naps and does admit to stressors causing some degree of anxiety and wonders if this may be impacting her sleep. She's also concerned about allergies and requests an allergy test. She has lived in [place] all her life and this is the first year she had an extensive episode of bronchitis.   Past Medical History  Diagnosis Date  . Attention deficit disorder   . Anxiety   . Dyspareunia   . Urinary incontinence    Past Surgical History  Procedure Laterality Date  . Incontinence surgery  03/2002    [facility] sling--Dr. Di Kindle in  [facility]  . Abdominal hysterectomy  2001    [facility]-Dr. Miguel Aschoff  . Breast surgery    . Augmentation mammaplasty  2002  . Cervical fusion     Allergies  Allergen Reactions  . Vicodin (Hydrocodone-Acetaminophen)     History   Social History  . Marital Status: Single    Spouse Name: N/A    Number of Children: N/A  . Years  of Education: N/A   Occupational History  . Not on file.   Social History Main Topics  . Smoking status: Never Smoker   . Smokeless tobacco: Never Used  . Alcohol Use: Yes     Comment: socially  . Drug Use: No  . Sexually Active: Yes    Birth Control/ Protection: Surgical     Comment: [facility] 2001   Other Topics Concern  . Not on file   Social History Narrative  . No narrative on file    Family History  Problem Relation Age of Onset  . Depression Mother   . Asthma Mother   . Diabetes Father   . Hypertension Father   . Heart disease Father   . Kidney disease Father   . Breast cancer Maternal Grandmother   . Hypertension Maternal Grandmother   . Hypertension Maternal Grandfather   . Stroke Maternal Grandfather   . Hypertension Paternal Grandfather   . Stroke Paternal Grandfather       Review of Systems  Constitutional: Negative for fever, appetite change and unexpected weight change.  HENT: Positive for sneezing. Negative for ear pain, congestion, sore throat, trouble swallowing and dental problem.   Respiratory: Positive for cough and shortness of breath.   Cardiovascular: Negative for chest pain, palpitations and leg swelling.  Gastrointestinal: Negative for nausea and abdominal pain.  Musculoskeletal: Negative  for joint swelling.  Skin: Negative for rash.  Neurological: Negative for headaches.  Psychiatric/Behavioral: Negative for dysphoric mood. The patient is nervous/anxious.        Objective:   Physical Exam  Gen. Pleasant, well-nourished, in no distress, anxious affect ENT - no lesions, no post nasal drip Neck: No JVD, no thyromegaly, no carotid bruits Lungs: no use of accessory muscles, no dullness to percussion, clear without rales or rhonchi  Cardiovascular: Rhythm regular, heart sounds  normal, no murmurs or gallops, no peripheral edema Abdomen: soft and non-tender, no hepatosplenomegaly, BS normal. Musculoskeletal: No deformities, no cyanosis or  clubbing Neuro:  alert, non focal        Assessment & Plan:

## 2012-08-07 NOTE — Assessment & Plan Note (Signed)
Given lack excessive daytime somnolence, narrow pharyngeal exam, witnessed apneas & loud snoring, obstructive sleep apnea is less likely. The pathophysiology of obstructive sleep apnea , it's cardiovascular consequences & modes of treatment including CPAP were discused with the patient in detail & they evidenced understanding. Low pre-test prob  Home sleep study Trial of MELATONIN 3-5 mg for sleep onset , take 2hr prior to bedtime

## 2012-08-08 LAB — ALLERGY FULL PROFILE
Allergen, D pternoyssinus,d7: <0.1 kU/L
Allergen,Goose feathers, e70: <0.1 kU/L
Alternaria Alternata: <0.1 kU/L
Aspergillus fumigatus, m3: <0.1 kU/L
Bermuda Grass: <0.1 kU/L
Candida Albicans: <0.1 kU/L
Cat Dander: 0.48 kU/L — ABNORMAL HIGH
Common Ragweed: 0.14 kU/L — ABNORMAL HIGH
D. farinae: <0.1 kU/L
Dog Dander: 0.47 kU/L — ABNORMAL HIGH
Goldenrod: <0.1 kU/L
Helminthosporium halodes: <0.1 kU/L
House Dust Hollister: 0.27 kU/L — ABNORMAL HIGH
IgE (Immunoglobulin E), Serum: 19.6 IU/mL (ref 0.0–180.0)
Lamb's Quarters: <0.1 kU/L
Oak: <0.1 kU/L
Plantain: <0.1 kU/L
Stemphylium Botryosum: <0.1 kU/L

## 2012-08-15 ENCOUNTER — Telehealth: Payer: Self-pay | Admitting: *Deleted

## 2012-08-15 ENCOUNTER — Ambulatory Visit: Payer: BC Managed Care – PPO | Admitting: Internal Medicine

## 2012-08-15 NOTE — Telephone Encounter (Signed)
Call to patient and scheduled for urodynamics for 08-30-12 and PUS/consult for 09-10-12.  Instructions given.  Notified of OOP estimate as $15 co-pay.

## 2012-08-20 ENCOUNTER — Telehealth: Payer: Self-pay | Admitting: *Deleted

## 2012-08-20 NOTE — Telephone Encounter (Signed)
Message copied by Tommie Sams on Mon Aug 20, 2012  9:09 AM ------      Message from: Cyril Mourning V      Created: Mon Aug 20, 2012  9:03 AM       Pl document in chart      ----- Message -----         From: Tobe Sos         Sent: 08/20/2012   8:50 AM           To: Oretha Milch, MD            Hey dr Vassie Loll this pt's bcbs denied her home sleep study and she does not want to do a sleep study@sleep  center right now FYI thanks libby       ------

## 2012-08-22 ENCOUNTER — Telehealth: Payer: Self-pay | Admitting: *Deleted

## 2012-08-22 NOTE — Telephone Encounter (Signed)
Contacted patient and appts scheduled and instructions given.  Patient will need to come for U/A prioer to procedure and arrive for urodynamics on 08-30-12 with full bladder. Patietn prefers to come early on day of procedure to have U/A done to avoid two trips.  Aware that we would have to cancel procedure if any signs of infection. Agreeable.

## 2012-08-22 NOTE — Telephone Encounter (Signed)
Message copied by Alisa Graff on Wed Aug 22, 2012  9:57 AM ------      Message from: Conley Simmonds      Created: Thu Jul 26, 2012 12:18 PM      Regarding: precert urodynamics and pelvic ultrasound       Carolynn,            This patient needs a precert for urodynamics and pelvic ultrasound.  I could not do an order for the urodynamics because I do not know the proper code.            Kennon Rounds,            I think it will be best to do these two visits on separate days.  If we do the urodynamics at the first visit, I can speak with her about the results and do the ultrasound with a talk on the second visit.            Thank you both,            Conley Simmonds ------

## 2012-08-29 ENCOUNTER — Ambulatory Visit (INDEPENDENT_AMBULATORY_CARE_PROVIDER_SITE_OTHER): Payer: BC Managed Care – PPO | Admitting: Internal Medicine

## 2012-08-29 ENCOUNTER — Encounter: Payer: Self-pay | Admitting: Internal Medicine

## 2012-08-29 VITALS — BP 129/80 | HR 100 | Temp 98.5°F | Resp 16 | Wt 158.0 lb

## 2012-08-29 DIAGNOSIS — J45909 Unspecified asthma, uncomplicated: Secondary | ICD-10-CM

## 2012-08-29 DIAGNOSIS — F988 Other specified behavioral and emotional disorders with onset usually occurring in childhood and adolescence: Secondary | ICD-10-CM | POA: Insufficient documentation

## 2012-08-29 DIAGNOSIS — F4323 Adjustment disorder with mixed anxiety and depressed mood: Secondary | ICD-10-CM

## 2012-08-29 MED ORDER — AMPHETAMINE-DEXTROAMPHETAMINE 10 MG PO TABS: 10.0000 mg | ORAL_TABLET | Freq: Two times a day (BID) | ORAL | Status: DC

## 2012-08-29 MED ORDER — ESCITALOPRAM 5 MG HALF TABLET: 5.0000 mg | ORAL_TABLET | Freq: Every day | ORAL | Status: DC

## 2012-08-29 NOTE — Patient Instructions (Addendum)
See me in 4-5 weeks  Taper off Zoloft take tablet every other day for one week then can begin Lexapro

## 2012-08-29 NOTE — Progress Notes (Signed)
Subjective:    Patient ID: Kaitlyn Carter, female    DOB: 04/24/1965, 47 y.o.   MRN: [id number]  HPI Kaitlyn Carter is here for follow up.    She reports she has not needed any inhaler for several weeks now  No cough, no SOB no wheezing.   She did see Dr. Vassie Carter who did allergy testing and she was mildly allergic to cats and dogs.  She doe have a persian cat that will sleep in the bed at times  She believes Zoloft is giving her headaches.  She would like to try an alternative  Since being off her ADderall for the last 30 days she has been very irritable.  She reports having ADD in childhood and treated with Ritalin in the past.  She reports the Adderall calms her and helps her to sleep.  She has tried melatonin but this has not helped her sleep  Allergies  Allergen Reactions  . Vicodin (Hydrocodone-Acetaminophen)    Past Medical History  Diagnosis Date  . Attention deficit disorder   . Anxiety   . Dyspareunia   . Urinary incontinence    Past Surgical History  Procedure Laterality Date  . Incontinence surgery  03/2002    [facility] sling--Dr. Di Carter in  [facility]  . Abdominal hysterectomy  2001    [facility]-Dr. Miguel Carter  . Breast surgery    . Augmentation mammaplasty  2002  . Cervical fusion     History   Social History  . Marital Status: Single    Spouse Name: N/A    Number of Children: N/A  . Years of Education: N/A   Occupational History  . Not on file.   Social History Main Topics  . Smoking status: Never Smoker   . Smokeless tobacco: Never Used  . Alcohol Use: Yes     Comment: socially  . Drug Use: No  . Sexually Active: Yes    Birth Control/ Protection: Surgical     Comment: [facility] 2001   Other Topics Concern  . Not on file   Social History Narrative  . No narrative on file   Family History  Problem Relation Age of Onset  . Depression Mother   . Asthma Mother   . Diabetes Father   . Hypertension Father   . Heart disease Father   . Kidney disease Father   .  Breast cancer Maternal Grandmother   . Hypertension Maternal Grandmother   . Hypertension Maternal Grandfather   . Stroke Maternal Grandfather   . Hypertension Paternal Grandfather   . Stroke Paternal Grandfather    Patient Active Problem List   Diagnosis Date Noted  . ADD (attention deficit disorder) 08/29/2012  . Snoring 07/19/2012  . Hypercalcemia 07/19/2012  . 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 Adjustment disorder with mixed anxiety and depressed mood 07/03/2012  . Urinary incontinence 07/03/2012  . Asthmatic bronchitis 07/03/2012  . Allergic rhinitis 07/03/2012  . S/P hysterectomy 07/03/2012   Current Outpatient Prescriptions on File Prior to Visit  Medication Sig Dispense Refill  . cetirizine (ZYRTEC) 10 MG tablet Take 10 mg by mouth daily.      . cholecalciferol (VITAMIN D) 1000 UNITS tablet Take 1,000 Units by mouth daily. Pt takes 2000 units daily      . estradiol (ESTRACE) 0.1 MG/GM vaginal cream Use 1/2 g vaginally every night for the first 2 weeks, then use 1/2 g vaginally two or three times per week as needed to maintain symptom relief.  42.5  g  0  . sertraline (ZOLOFT) 50 MG tablet Take 2 tablets daily  60 tablet  1  . albuterol (PROVENTIL HFA;VENTOLIN HFA) 108 (90 BASE) MCG/ACT inhaler Inhale 2 puffs into the lungs every 6 (six) hours as needed for wheezing.       No current facility-administered medications on file prior to visit.       Review of Systems See HPI    Objective:   Physical Exam Physical Exam  Nursing note and vitals reviewed.  Constitutional: She is oriented to person, place, and time. She appears well-developed and well-nourished.  HENT:  Head: Normocephalic and atraumatic.  Cardiovascular: Normal rate and regular rhythm. Exam reveals no gallop and no friction rub.  No murmur heard.  Pulmonary/Chest: Breath sounds normal. She has no wheezes. She has no rales.  Neurological: She is alert and oriented to person, place, and time.  Skin:  Skin is warm and dry. EXt:  No edema  Psychiatric: She has a normal mood and affect. Her behavior is normal.             Assessment & Plan:  Asthmatic bronchitis  Peak flow today over 400.  Ok to stay off inhalers for now  History of ADD  Will refer to psychiatry and give enough adderall for 30 days.  I informed pt that I am not experienced with adult ADD and she would need eval with psyhciatry.    Allergic rhinitis  Advised not to have cat sleep anywhere in bedroom.   Her do sleeps in crate at night.    Depression  willl taper off Zoloft over the next week and try lexapro  She is to follow up with me in 4-6 weeks

## 2012-08-30 ENCOUNTER — Ambulatory Visit (INDEPENDENT_AMBULATORY_CARE_PROVIDER_SITE_OTHER): Payer: BC Managed Care – PPO | Admitting: Obstetrics and Gynecology

## 2012-08-30 ENCOUNTER — Encounter: Payer: Self-pay | Admitting: Obstetrics and Gynecology

## 2012-08-30 DIAGNOSIS — T8389XA Other specified complication of genitourinary prosthetic devices, implants and grafts, initial encounter: Secondary | ICD-10-CM

## 2012-08-30 DIAGNOSIS — R32 Unspecified urinary incontinence: Secondary | ICD-10-CM

## 2012-08-30 DIAGNOSIS — N952 Postmenopausal atrophic vaginitis: Secondary | ICD-10-CM

## 2012-08-30 DIAGNOSIS — N393 Stress incontinence (female) (male): Secondary | ICD-10-CM

## 2012-08-30 DIAGNOSIS — T83711A Erosion of implanted vaginal mesh and other prosthetic materials to surrounding organ or tissue, initial encounter: Secondary | ICD-10-CM

## 2012-08-30 MED ORDER — ESTRADIOL 0.1 MG/GM VA CREA: TOPICAL_CREAM | VAGINAL | Status: DC

## 2012-08-30 NOTE — Patient Instructions (Signed)
I will see you for your pelvic ultrasound on September 10, 2012.

## 2012-08-30 NOTE — Progress Notes (Signed)
Patient ID: Kaitlyn Carter, female   DOB: 12-Nov-1965, 47 y.o.   MRN: [id number]  Subjective  Patient presents for urodynamic testing today.    Has an appointment on July 14 for a pelvic ultrasound to evaluate chronic LLQ pain.   Patient states that her husband has seen a urologist in [facility] who believes the female dyspareunia and granulation tissue is due to the patient's midurethral sling.  Patient is using Estrace 1/2 gram per vagina twice a week.  Patient would like to have surgical therapy.  Objective  Multichannel urodynamic testing performed and reviewed.   Uroflow study - Continuous, Volume voided 704 ml, Flow time , 6 sec, Qura Max 50 ml.sec, PVR 265 cc. CMG study - VLLP 94 cm H20 at 536 ml.  Early first sensation.  Max capacity 526 ml. Urethral pressure profile - MUCP 28 cm H20. Pressure flow study -    Assessment  Persistent genuine stress incontinence.  Status post Spark sling.  Near-erosion in vagina.   Female and female dyspareunia.  Vaginal atrophy.  LLQ chronic pain.  Plan  I had a long discussion with the patient and her husband regarding her diagnosis of recurrent stress incontinence and the near erosion of the sling.  I discussed the need for increasing vaginal estrogen treatment to Estrace 2 gm per vagina twice a week.  I discussed surgical removal of the current sling which could lead to cystotomies, urethrotomies, worsening incontinence, and need for prolonged catheterization. They understand that the entire sling would not be possible to remove.    We also discussed a pubovaginal sling with cadaveric fascia as an alternative to a repeat midurethral sling with synthetic material.    We also discussed using biologic graft material if there were difficulty closing the vaginal mucosa.   They understand that the patient's future sling procedure may need to be performed in future surgical setting if intraoperative complications occur from removal of the  old Spark sling.   I will ask Dr. Lorin Picket MacDiarmid to assist in the preop and operative management of this patient.    Patient will follow up on July 14 for a pelvic ultrasound.   An After Visit summary was given to the patient.

## 2012-09-01 ENCOUNTER — Other Ambulatory Visit: Payer: Self-pay | Admitting: Internal Medicine

## 2012-09-03 ENCOUNTER — Telehealth: Payer: Self-pay | Admitting: *Deleted

## 2012-09-03 NOTE — Telephone Encounter (Signed)
Patient notified of appt. With [facility]. July 30th @ 12:45pm. Patient to call and reschedule . Will be out of town. sue

## 2012-09-03 NOTE — Telephone Encounter (Signed)
Refill request

## 2012-09-03 NOTE — Telephone Encounter (Signed)
Kaitlyn Carter  This Zoloft is discontinued  I changed her to Lexapro

## 2012-09-05 ENCOUNTER — Telehealth: Payer: Self-pay | Admitting: *Deleted

## 2012-09-05 NOTE — Telephone Encounter (Signed)
Results of Urodynamics faxed to Dr. Fuller Mandril Diarmid for patient from Dr. Edward Jolly.  Patient appt. With Dr. Sherron Monday  Is July 30th@ 12:45pm/  Results sent to [facility] to be scanned into [facility]

## 2012-09-10 ENCOUNTER — Encounter: Payer: Self-pay | Admitting: Obstetrics and Gynecology

## 2012-09-10 ENCOUNTER — Ambulatory Visit (INDEPENDENT_AMBULATORY_CARE_PROVIDER_SITE_OTHER): Payer: BC Managed Care – PPO

## 2012-09-10 ENCOUNTER — Ambulatory Visit (INDEPENDENT_AMBULATORY_CARE_PROVIDER_SITE_OTHER): Payer: BC Managed Care – PPO | Admitting: Obstetrics and Gynecology

## 2012-09-10 ENCOUNTER — Other Ambulatory Visit: Payer: Self-pay | Admitting: Obstetrics and Gynecology

## 2012-09-10 VITALS — BP 100/66 | HR 88 | Ht 65.0 in

## 2012-09-10 DIAGNOSIS — R1032 Left lower quadrant pain: Secondary | ICD-10-CM

## 2012-09-10 DIAGNOSIS — N83209 Unspecified ovarian cyst, unspecified side: Secondary | ICD-10-CM

## 2012-09-10 DIAGNOSIS — N839 Noninflammatory disorder of ovary, fallopian tube and broad ligament, unspecified: Secondary | ICD-10-CM

## 2012-09-10 DIAGNOSIS — N831 Corpus luteum cyst of ovary, unspecified side: Secondary | ICD-10-CM

## 2012-09-10 DIAGNOSIS — N949 Unspecified condition associated with female genital organs and menstrual cycle: Secondary | ICD-10-CM

## 2012-09-10 DIAGNOSIS — N83202 Unspecified ovarian cyst, left side: Secondary | ICD-10-CM

## 2012-09-10 NOTE — Progress Notes (Signed)
Subjective  Here for pelvic ultrasound for persistent LLQ pain.    Denies hot flashes and night sweats.    Patient uses gonadotropins for ovarian stimulation.  Sister in law was a surrogate for the patient. Patient did this after hysterectomy.   Had two children that patient carried personally prior to her hysterectomy.  Patient has an appointment to see Dr. Sherron Monday in approximately one month.     Objective  See ultrasound below - 2.75 cm complex left ovarian cyst with blood flow in the septum.  Normal right ovary.    Assessment  Complex left ovarian cyst. Status post gonadotropin therapy. Chronic LLQ pain. Status post Spark sling. Near erosion of the sling into the vagina. Female and female dyspareunia. Genuine stress incontinence.  Plan  Will check CA125. Proceed with CT scan to rule out other etiologies of LLQ pain. Will plan for a repeat ultrasound in 6 weeks to determine if left oophorectomy will need to be performed at the time of the patient's bladder surgery. I discussed prophylactic removal of the fallopian tubes as a way to reduce ovarian cancer risk.

## 2012-09-10 NOTE — Patient Instructions (Signed)
We will set up an appointment for an abdominal CT scan to be performed.

## 2012-09-13 ENCOUNTER — Telehealth: Payer: Self-pay | Admitting: Orthopedic Surgery

## 2012-09-13 NOTE — Telephone Encounter (Signed)
Spoke with pt about CT scan appt at [facility] 09-17-12. Pt can arrive 1.5 hrs early to drink contrast, and scan will start at 2 pm. Advised pt to not eat lunch beforehand, as she cannot eat solids 4 hrs before test. Pt can drink liquids or take meds. [address] location. Phone number given. Pt agreeable.

## 2012-09-17 ENCOUNTER — Ambulatory Visit
Admission: RE | Admit: 2012-09-17 | Discharge: 2012-09-17 | Disposition: A | Discharge status: Home / Self Care | Payer: BC Managed Care – PPO | Source: Ambulatory Visit | Attending: Obstetrics and Gynecology | Admitting: Obstetrics and Gynecology

## 2012-09-17 DIAGNOSIS — R1032 Left lower quadrant pain: Secondary | ICD-10-CM

## 2012-09-17 MED ORDER — IOHEXOL 300 MG/ML  SOLN
100.0000 mL | Freq: Once | INTRAMUSCULAR | Status: AC | PRN
  Administered 2012-09-17: 100 mL via INTRAVENOUS

## 2012-09-18 ENCOUNTER — Other Ambulatory Visit: Payer: Self-pay | Admitting: Neurosurgery

## 2012-09-18 DIAGNOSIS — M5412 Radiculopathy, cervical region: Secondary | ICD-10-CM

## 2012-09-19 ENCOUNTER — Telehealth: Payer: Self-pay | Admitting: Obstetrics and Gynecology

## 2012-09-19 NOTE — Telephone Encounter (Signed)
This is continuation of the documentation regarding the CT scan of the abdomen and pelvis.  There is a 1.6 cm left renal mass which is not a simple cyst.    Patient has a consultation with Dr. Alfredo Martinez at [facility] on August 11.   Dr. Sherron Monday will render an opinion regarding this.

## 2012-09-19 NOTE — Telephone Encounter (Signed)
Phone call to discuss the CT scan of the abdomen and pelvis ordered for LLQ pain and prior midurethral sling now with vaginal erosion.  CT shows a complex left ovarian cyst, which is known based on the patient's office ultrasound.   There is also a left kidney area

## 2012-10-01 ENCOUNTER — Other Ambulatory Visit: Payer: BC Managed Care – PPO

## 2012-10-08 ENCOUNTER — Other Ambulatory Visit (HOSPITAL_COMMUNITY): Payer: Self-pay | Admitting: Urology

## 2012-10-08 DIAGNOSIS — N281 Cyst of kidney, acquired: Secondary | ICD-10-CM

## 2012-10-10 ENCOUNTER — Ambulatory Visit (HOSPITAL_COMMUNITY)
Admission: RE | Admit: 2012-10-10 | Discharge: 2012-10-10 | Disposition: A | Discharge status: Home / Self Care | Payer: BC Managed Care – PPO | Source: Ambulatory Visit | Attending: Urology | Admitting: Urology

## 2012-10-10 ENCOUNTER — Ambulatory Visit: Payer: BC Managed Care – PPO | Admitting: Internal Medicine

## 2012-10-10 DIAGNOSIS — N281 Cyst of kidney, acquired: Secondary | ICD-10-CM

## 2012-10-15 ENCOUNTER — Other Ambulatory Visit: Payer: BC Managed Care – PPO

## 2012-10-18 ENCOUNTER — Other Ambulatory Visit: Payer: BC Managed Care – PPO

## 2012-10-22 ENCOUNTER — Ambulatory Visit
Admission: RE | Admit: 2012-10-22 | Discharge: 2012-10-22 | Disposition: A | Discharge status: Home / Self Care | Payer: BC Managed Care – PPO | Source: Ambulatory Visit | Attending: Neurosurgery | Admitting: Neurosurgery

## 2012-10-22 VITALS — BP 98/79 | HR 73

## 2012-10-22 DIAGNOSIS — M5412 Radiculopathy, cervical region: Secondary | ICD-10-CM

## 2012-10-22 MED ORDER — DIAZEPAM 5 MG PO TABS
10.0000 mg | ORAL_TABLET | Freq: Once | ORAL | Status: AC
  Administered 2012-10-22: 10 mg via ORAL

## 2012-10-22 MED ORDER — IOHEXOL 300 MG/ML  SOLN
10.0000 mL | Freq: Once | INTRAMUSCULAR | Status: AC | PRN
  Administered 2012-10-22: 10 mL via INTRATHECAL

## 2012-10-22 NOTE — Progress Notes (Signed)
Patient's husband at bedside.  Patient resing quietly without complaint.

## 2012-10-22 NOTE — Progress Notes (Signed)
Patient states she has been off Effexor and Adderall for the past month.  Discharge instructions explained to patient and her husband.

## 2012-10-25 ENCOUNTER — Encounter: Payer: Self-pay | Admitting: Obstetrics and Gynecology

## 2012-10-25 ENCOUNTER — Ambulatory Visit (INDEPENDENT_AMBULATORY_CARE_PROVIDER_SITE_OTHER): Payer: BC Managed Care – PPO

## 2012-10-25 ENCOUNTER — Ambulatory Visit (INDEPENDENT_AMBULATORY_CARE_PROVIDER_SITE_OTHER): Payer: BC Managed Care – PPO | Admitting: Obstetrics and Gynecology

## 2012-10-25 VITALS — BP 110/80 | HR 76 | Ht 65.0 in | Wt 162.5 lb

## 2012-10-25 DIAGNOSIS — N83202 Unspecified ovarian cyst, left side: Secondary | ICD-10-CM

## 2012-10-25 DIAGNOSIS — N83209 Unspecified ovarian cyst, unspecified side: Secondary | ICD-10-CM

## 2012-10-25 DIAGNOSIS — F329 Major depressive disorder, single episode, unspecified: Secondary | ICD-10-CM

## 2012-10-25 MED ORDER — ESCITALOPRAM OXALATE 10 MG PO TABS: 10.0000 mg | ORAL_TABLET | Freq: Every day | ORAL | Status: DC

## 2012-10-25 MED ORDER — ESCITALOPRAM 5 MG HALF TABLET: 10.0000 mg | ORAL_TABLET | Freq: Every day | ORAL | Status: DC

## 2012-10-25 NOTE — Patient Instructions (Addendum)
Escitalopram tablets What is this medicine? ESCITALOPRAM (es sye TAL oh pram) is used to treat depression and certain types of anxiety. This medicine may be used for other purposes; ask your health care provider or pharmacist if you have questions. What should I tell my health care provider before I take this medicine? They need to know if you have any of these conditions: -bipolar disorder or a family history of bipolar disorder -diabetes -heart disease -kidney or liver disease -receiving electroconvulsive therapy -seizures (convulsions) -suicidal thoughts, plans, or attempt by you or a family member -an unusual or allergic reaction to escitalopram, the related drug citalopram, other medicines, foods, dyes, or preservatives -pregnant or trying to become pregnant -breast-feeding How should I use this medicine? Take this medicine by mouth with a glass of water. Follow the directions on the prescription label. You can take it with or without food. If it upsets your stomach, take it with food. Take your medicine at regular intervals. Do not take it more often than directed. Do not stop taking this medicine suddenly except upon the advice of your doctor. Stopping this medicine too quickly may cause serious side effects or your condition may worsen. A special MedGuide will be given to you by the pharmacist with each prescription and refill. Be sure to read this information carefully each time. Talk to your pediatrician regarding the use of this medicine in children. Special care may be needed. Overdosage: If you think you have taken too much of this medicine contact a poison control center or emergency room at once. NOTE: This medicine is only for you. Do not share this medicine with others. What if I miss a dose? If you miss a dose, take it as soon as you can. If it is almost time for your next dose, take only that dose. Do not take double or extra doses. What may interact with this medicine? Do  not take this medicine with any of the following medications: -cisapride -citalopram -linezolid -MAOIs like Carbex, Eldepryl, Marplan, Nardil, and Parnate -methylene blue (injected into a vein) -pimozide This medicine may also interact with the following medications: -alcohol -aspirin and aspirin-like medicines -carbamazepine -certain medicines for depression, anxiety, or psychotic disturbances -certain medicines for migraine headache like almotriptan, eletriptan, frovatriptan, naratriptan, rizatriptan, sumatriptan, zolmitriptan -cimetidine -diuretics -fentanyl -furazolidone -isoniazid -ketoconazole -lithium -medicines that treat or prevent blood clots like warfarin, enoxaparin, and dalteparin -medicines for sleep -metoprolol -NSAIDs, medicines for pain and inflammation, like ibuprofen or naproxen -procarbazine -rasagiline -supplements like St. John's wort, kava kava, valerian -tramadol -tryptophan This list may not describe all possible interactions. Give your health care provider a list of all the medicines, herbs, non-prescription drugs, or dietary supplements you use. Also tell them if you smoke, drink alcohol, or use illegal drugs. Some items may interact with your medicine. What should I watch for while using this medicine? Tell your doctor if your symptoms do not get better or if they get worse. Visit your doctor or health care professional for regular checks on your progress. Because it may take several weeks to see the full effects of this medicine, it is important to continue your treatment as prescribed by your doctor. Patients and their families should watch out for new or worsening thoughts of suicide or depression. Also watch out for sudden changes in feelings such as feeling anxious, agitated, panicky, irritable, hostile, aggressive, impulsive, severely restless, overly excited and hyperactive, or not being able to sleep. If this happens, especially at the beginning of  treatment or after a change in dose, call your health care professional. Bonita Quin may get drowsy or dizzy. Do not drive, use machinery, or do anything that needs mental alertness until you know how this medicine affects you. Do not stand or sit up quickly, especially if you are an older patient. This reduces the risk of dizzy or fainting spells. Alcohol may interfere with the effect of this medicine. Avoid alcoholic drinks. Your mouth may get dry. Chewing sugarless gum or sucking hard candy, and drinking plenty of water may help. Contact your doctor if the problem does not go away or is severe. What side effects may I notice from receiving this medicine? Side effects that you should report to your doctor or health care professional as soon as possible: -allergic reactions like skin rash, itching or hives, swelling of the face, lips, or tongue -confusion -feeling faint or lightheaded, falls -fast talking and excited feelings or actions that are out of control -hallucination, loss of contact with reality -seizures -suicidal thoughts or other mood changes -unusual bleeding or bruising Side effects that usually do not require medical attention (report to your doctor or health care professional if they continue or are bothersome): -blurred vision -changes in appetite -change in sex drive or performance -headache -increased sweating -nausea This list may not describe all possible side effects. Call your doctor for medical advice about side effects. You may report side effects to FDA at 1-800-FDA-1088. Where should I keep my medicine? Keep out of reach of children. Store at room temperature between 15 and 30 degrees C (59 and 86 degrees F). Throw away any unused medicine after the expiration date. NOTE: This sheet is a summary. It may not cover all possible information. If you have questions about this medicine, talk to your doctor, pharmacist, or health care provider.  2013, Elsevier/Gold Standard.  (07/01/2011 7:12:08 PM)

## 2012-10-25 NOTE — Progress Notes (Signed)
Subjective  Patient and husband present today.   Patient is here for a follow up ultrasound of a complex ovarian cyst seen on 09/10/12:  2.75 cm with vascular flow within a septum. CA125 was 8.9.  Has had chronic LLQ pain for 2 years.    CT scan on 09/17/12 showed In the mid pole of the left kidney, there is a 1.4 x 1.6 x 1.3 cm hypodense lesion.  The bladder and uterus are unremarkable. Cystic mass is identified in the left ovary, measuring 2.5 x 3.1 by 2.7 cm.  Patient has a follow up appointment with Dr. Sherron Monday today to further discuss removal of her previously placed  Spark synthetic sling causing female and female dyspareunia and to discuss potential placement of pubovaginal sling at the same time.    Patient wants surgery for LLQ pain, dyspareunia, and urinary incontinence.  Patient is also asking for help for depression today.  States that she was expecting to receive a prescription for Lexapro from her PCP, but that this did not happen.  She was referred to Dr. Evelene Croon, but is not able to have an appointment until November.  She is currently also off her Adderall. She reports marital discord and her partner's infidelity.  They are going to see Vonna Kotyk, therapist.  She states suicidal ideation but that she would not act due to the fact that she has her daughter to care for.  Her relationship with family is strained.  She is on her husband's insurance only until the end of this year.    Objective  General - Patient tearful and distraught when discussing her marriage and depression.  Ultrasound - see below - Left ovary with two simple cysts 16 mm and 10 mm and no abnormal blood flow.  Right ovary normal.      Assessment  Chronic LLQ pain.  Left ovarian cysts. Status post hysterectomy. Status post Spark sling with persistent urinary stress incontinence.  Female and female dyspareunia. Perimenopausal changes to vaginal mucosa.  On estrogen  cream. Depression.  Plan  Proceed with laparoscopy with left salpingo-oophorectomy, right salpingectomy,and lysis of adhesions to be coordinated with Dr. Sherron Monday for removal of the patient's prior Spark sling and potential pubovaginal sling with ?cadaveric fascia.  I have discussed risks, benefits, and alternatives to the laparoscopy and the patient wishes to proceed.  Risks include but are not limited to bleeding, infection, trauma to surrounding organs, DVT, PE, death, reaction to anesthesia, continued pain, formation of scar tissue, hernia formation, and need for reoperation.   Start Lexapro 10 mg daily.  See Epic orders.  I told the patient it will take a couple of weeks before she can see a difference in her affect. She agrees that she can reach out for help if she if feeling suicidal.  She has an appointment to see therapist, Vonna Kotyk.  I have recommended the patient tell her therapist about her suicidal thoughts. I informed the patient's husband to take his wife's thoughts about suicide seriously. My office will call Dr. Loralie Champagne office see if we are able to make a psychiatry appointment for the patient soon so she can maximize her medical management. Patient and husband informed about the option to go to any Emergency Department or Behavioral Health if the patient is in mental health crisis.

## 2012-10-31 ENCOUNTER — Telehealth: Payer: Self-pay | Admitting: Obstetrics and Gynecology

## 2012-10-31 NOTE — Telephone Encounter (Signed)
Patient called back approximately 430 pm to report that she has just left the neurosurgeon's office and she needs "4 level spinal fusion" and that this is somewhat urgent due to "one disc nearly flattened spinal cord."  Neurosurgeon recommends she have fusin first and then can have GYN surgery 8 weeks later if there is no concern for any cancerous cells.  If GYN surgery is urgent, he recommends anesthesia speak with him regarding case. For now, patient plans to cancel surgery with Dr Edward Jolly and plan spinal fusion in the next week or so.  Once she has date, she will call us back to get our case scheduled for 8 weeks later.

## 2012-10-31 NOTE — Telephone Encounter (Signed)
Thank you and please inform Dr. Mina Marble office also.

## 2012-10-31 NOTE — Telephone Encounter (Signed)
Patient calling for Kaitlyn Carter to schedule surgery.

## 2012-10-31 NOTE — Telephone Encounter (Signed)
Kennon Rounds,  Thank you for your assistance to help this patient.  [employer]

## 2012-10-31 NOTE — Telephone Encounter (Signed)
After speaking with Dr MacDiarmid's office, patient notified surgery is scheduled for 12-04-12 at 0730 at [facility]. Instructions given and mailed. Pre/post op appts scheduled.  Several attempts to find female psychiatrist with available appointment unsuccessful.  Dr Nolen Mu first avail end of Oct. And Dr Lafayette Dragon first available in November.  NP at Dr Nolen Mu has appt in early Oct.  Patient notified of this information.  She states she is feeling so  Much better with the Lexapro that she would prefer to wait till the already scheduled appt with Dr Lafayette Dragon on 01-03-13.  Stressed that NP is available much sooner and is an excellent option that we have used before Kaitlyn Carter) and patient will call if she needs appointment.

## 2012-11-01 ENCOUNTER — Other Ambulatory Visit: Payer: Self-pay | Admitting: Neurosurgery

## 2012-11-01 NOTE — Telephone Encounter (Signed)
Pam at Dr MacDiarmid's office was notified on 10-31-12.

## 2012-11-01 NOTE — Telephone Encounter (Signed)
Patient returniing Kaitlyn Carter's call.

## 2012-11-08 ENCOUNTER — Encounter (HOSPITAL_COMMUNITY): Payer: Self-pay | Admitting: Respiratory Therapy

## 2012-11-12 NOTE — Telephone Encounter (Signed)
Patient calling to reschedule her surgery

## 2012-11-13 NOTE — Telephone Encounter (Signed)
Patient's neurosurgery is scheduled for this Fri 11-16-12 and patient would like to resched GYN surgery for end of November early Dec. Will call her back once surgery resched.  She is expecting 4-5 day hospitalization starting Fri.

## 2012-11-14 ENCOUNTER — Encounter (HOSPITAL_COMMUNITY)
Admission: RE | Admit: 2012-11-14 | Discharge: 2012-11-14 | Disposition: A | Discharge status: Home / Self Care | Payer: BC Managed Care – PPO | Source: Ambulatory Visit | Attending: Neurosurgery | Admitting: Neurosurgery

## 2012-11-14 ENCOUNTER — Encounter (HOSPITAL_COMMUNITY): Payer: Self-pay

## 2012-11-14 HISTORY — DX: Other specified postprocedural states: Z98.890

## 2012-11-14 HISTORY — DX: Other specified postprocedural states: R11.2

## 2012-11-14 HISTORY — DX: Headache: R51

## 2012-11-14 HISTORY — DX: Other seasonal allergic rhinitis: J30.2

## 2012-11-14 HISTORY — DX: Unspecified osteoarthritis, unspecified site: M19.90

## 2012-11-14 LAB — BASIC METABOLIC PANEL
Chloride: 102 mEq/L (ref 96–112)
Creatinine, Ser: 0.74 mg/dL (ref 0.50–1.10)
GFR calc Af Amer: >90 mL/min (ref >90)
Potassium: 4.2 mEq/L (ref 3.5–5.1)
Sodium: 137 mEq/L (ref 135–145)

## 2012-11-14 LAB — CBC
HCT: 41.3 % (ref 36.0–46.0)
Hemoglobin: 13.9 g/dL (ref 12.0–15.0)
RBC: 4.52 MIL/uL (ref 3.87–5.11)
RDW: 12.5 % (ref 11.5–15.5)
WBC: 6.1 10*3/uL (ref 4.0–10.5)

## 2012-11-14 LAB — SURGICAL PCR SCREEN
MRSA, PCR: NEGATIVE
Staphylococcus aureus: NEGATIVE

## 2012-11-14 NOTE — Pre-Procedure Instructions (Signed)
Kaitlyn Carter  11/14/2012   Your procedure is scheduled on: Friday November 16, 2012  Report to [facility] at 0530 AM.  Call this number if you have problems the morning of surgery: [phone]   Remember:   Do not eat food or drink liquids after midnight.   Take these medicines the morning of surgery with A SIP OF WATER: None             Discontinue Aspirin ,Herbal medication and Nsaids 7 days prior to surgery  Do not wear jewelry, make-up or nail polish.  Do not wear lotions, powders, or perfumes. You may wear deodorant.  Do not shave 48 hours prior to surgery.   Do not bring valuables to the hospital.  [facility] is not responsible for any belongings or valuables.  Contacts, dentures or bridgework may not be worn into surgery.  Leave suitcase in the car. After surgery it may be brought to your room.  For patients admitted to the hospital, checkout time is 11:00 AM the day of discharge.   Patients discharged the day of surgery will not be allowed to drive home.    Special Instructions: Shower using CHG 2 nights before surgery and the night before surgery.  If you shower the day of surgery use CHG.  Use special wash - you have one bottle of CHG for all showers.  You should use approximately 1/3 of the bottle for each shower.   Please read over the following fact sheets that you were given: Pain Booklet, Coughing and Deep Breathing, MRSA Information and Surgical Site Infection Prevention

## 2012-11-15 ENCOUNTER — Ambulatory Visit: Payer: Self-pay | Admitting: Obstetrics and Gynecology

## 2012-11-15 MED ORDER — CEFAZOLIN SODIUM-DEXTROSE 2-3 GM-% IV SOLR
2.0000 g | INTRAVENOUS | Status: AC
  Administered 2012-11-16: 2 g via INTRAVENOUS
  Filled 2012-11-15: qty 50

## 2012-11-15 NOTE — Progress Notes (Signed)
Anesthesia Chart Review:  Patient is a 47 year old female scheduled for C3-4, C4-5, C6-7 C7-T-1 ACDF on 11/16/12 by Dr. Jeral Fruit.  History includes C5-6 ACDF '03, non-smoker, ADD, anxiety, arthritis, headaches, hysterectomy, breast augmentation, post-operative N/V. PCP is Dr. Raechel Chute.    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 She had been evaluated by cardiologist Dr. Iline Oven Cheek in the past, but not since 2006.  She was seen for palpitations with occasional PAC's and PVC's (there was question of brief SVT on Holter from 2002--but "event monitor was negative for significant abnormalities.").  She had a negative stress echo for ischemia with somewhat limited echo images on 01/12/05.  There was no arrhythmias except for rare PVC's.  The EKG from Dr. Ledell Noss office is very dark and nearly impossible to read, but V1 seems to appears similar.  Otherwise, I do not currently have any additional EKGs for comparison.  Preoperative labs noted.  Patient is 47 year old without known cardiac history other than palpitations and occasional PAC's/PVC's. There is no DM, HTN, CHF, MI history.  No CV symptoms were documented at her PAT appointment.  If no acute changes then I would anticipate that she could proceed as planned.  Velna Ochs [facility]/Anesthesiology Phone [phone] 11/15/2012 11:34 AM

## 2012-11-15 NOTE — H&P (Signed)
Kaitlyn Carter is an 47 y.o. female.   Chief Complaint: neck pain HPI: patient seen by me  Back ox April , 2014, with neck pain with radiation to both upper extremities, associated with burning sensation, pain with neck lateralization , pain while driving. Several years ago she had 1 level acdf at 69. She is getting worse and decided to go ahead with surgery  Past Medical History  Diagnosis Date  . Attention deficit disorder   . Anxiety   . Dyspareunia   . Urinary incontinence   . PONV (postoperative nausea and vomiting)   . Seasonal allergies   . Headache(784.0)     due to neck  . Arthritis     Past Surgical History  Procedure Laterality Date  . Incontinence surgery  03/2002    [facility]Dr. Di Kindle in  [facility]  . Abdominal hysterectomy  2001    [facility]-Dr. Miguel Aschoff  . Breast surgery    . Augmentation mammaplasty  2002  . Cervical fusion      Family History  Problem Relation Age of Onset  . Depression Mother   . Asthma Mother   . Diabetes Father   . Hypertension Father   . Heart disease Father   . Kidney disease Father   . Breast cancer Maternal Grandmother   . Hypertension Maternal Grandmother   . Hypertension Maternal Grandfather   . Stroke Maternal Grandfather   . Hypertension Paternal Grandfather   . Stroke Paternal Grandfather    Social History:  reports that she has never smoked. She has never used smokeless tobacco. She reports that  drinks alcohol. She reports that she does not use illicit drugs.  Allergies:  Allergies  Allergen Reactions  . Adhesive [Tape] Rash  . Latex Rash    No prescriptions prior to admission    Results for orders placed during the hospital encounter of 11/14/12 (from the past 48 hour(s))  SURGICAL PCR SCREEN     Status: None   Collection Time    11/14/12  9:48 AM      Result Value Range   MRSA, PCR NEGATIVE  NEGATIVE   Staphylococcus aureus NEGATIVE  NEGATIVE   Comment:            The Xpert SA Assay (FDA   approved for NASAL specimens     in patients over 61 years of age),     is one component of     a comprehensive surveillance     program.  Test performance has     been validated by [employer] for patients greater     than or equal to 28 year old.     It is not intended     to diagnose infection nor to     guide or monitor treatment.  BASIC METABOLIC PANEL     Status: None   Collection Time    11/14/12  9:48 AM      Result Value Range   Sodium 137  135 - 145 mEq/L   Potassium 4.2  3.5 - 5.1 mEq/L   Chloride 102  96 - 112 mEq/L   CO2 26  19 - 32 mEq/L   Glucose, Bld 81  70 - 99 mg/dL   BUN 13  6 - 23 mg/dL   Creatinine, Ser 7.82  0.50 - 1.10 mg/dL   Calcium 9.7  8.4 - 95.6 mg/dL   GFR calc non Af Amer >90  >90 mL/min  GFR calc Af Amer >90  >90 mL/min   Comment: (NOTE)     The eGFR has been calculated using the CKD EPI equation.     This calculation has not been validated in all clinical situations.     eGFR's persistently <90 mL/min signify possible Chronic Kidney     Disease.  CBC     Status: None   Collection Time    11/14/12  9:48 AM      Result Value Range   WBC 6.1  4.0 - 10.5 K/uL   RBC 4.52  3.87 - 5.11 MIL/uL   Hemoglobin 13.9  12.0 - 15.0 g/dL   HCT 45.4  09.8 - 11.9 %   MCV 91.4  78.0 - 100.0 fL   MCH 30.8  26.0 - 34.0 pg   MCHC 33.7  30.0 - 36.0 g/dL   RDW 14.7  82.9 - 56.2 %   Platelets 259  150 - 400 K/uL   No results found.  Review of Systems  Constitutional: Negative.   HENT: Positive for neck pain.   Eyes: Negative.   Respiratory: Negative.   Cardiovascular: Negative.   Gastrointestinal: Negative.   Genitourinary: Negative.   Skin: Positive for itching.  Neurological: Positive for sensory change and focal weakness.  Endo/Heme/Allergies: Negative.   Psychiatric/Behavioral: Negative.     There were no vitals taken for this visit. Physical Exam hent. Nl. Neck, pain with movement. Lungs, clear. Cv.,nl. Abdomen, soft. Extremities, nl. NEURO  WEAKNESS OD DELTOIDS, TRICEPS. BICEPS NORAL. SENSOTY NORMAL BUT SHE HAS BURNING SENSATION IN BOTH UPPER EXTREMITIES. CERvical  Studies show stenosis with movement at 3-4, severe ddd at 45,67, and at c7t1  Right compromise but the canal and the left foamen is open.  Assessment/Plan Decompression and fusion at 34,45,67,. We probably leave the c7t1 alone and she is aware of it to allow her for flexibility and specially she does not have a right c8 radiculoaphy. If later she does have one, we can do a posterior foaminotomy. agres with the approavh and the risks and benetiss ot the surgery  Kaysan Peixoto M 11/15/2012, 9:38 PM

## 2012-11-16 ENCOUNTER — Inpatient Hospital Stay (HOSPITAL_COMMUNITY): Payer: BC Managed Care – PPO | Admitting: Certified Registered Nurse Anesthetist

## 2012-11-16 ENCOUNTER — Encounter (HOSPITAL_COMMUNITY): Payer: Self-pay | Admitting: Vascular Surgery

## 2012-11-16 ENCOUNTER — Encounter (HOSPITAL_COMMUNITY): Payer: Self-pay

## 2012-11-16 ENCOUNTER — Inpatient Hospital Stay (HOSPITAL_COMMUNITY)
Admission: RE | Admit: 2012-11-16 | Discharge: 2012-11-18 | DRG: 865 | Disposition: A | Discharge status: Home / Self Care | Payer: BC Managed Care – PPO | Source: Ambulatory Visit | Attending: Neurosurgery | Admitting: Neurosurgery

## 2012-11-16 ENCOUNTER — Inpatient Hospital Stay (HOSPITAL_COMMUNITY): Payer: BC Managed Care – PPO

## 2012-11-16 ENCOUNTER — Encounter (HOSPITAL_COMMUNITY)
Admission: RE | Disposition: A | Discharge status: Home / Self Care | Payer: Self-pay | Source: Ambulatory Visit | Attending: Neurosurgery

## 2012-11-16 DIAGNOSIS — F411 Generalized anxiety disorder: Secondary | ICD-10-CM | POA: Diagnosis present

## 2012-11-16 DIAGNOSIS — M47812 Spondylosis without myelopathy or radiculopathy, cervical region: Principal | ICD-10-CM | POA: Diagnosis present

## 2012-11-16 DIAGNOSIS — R41840 Attention and concentration deficit: Secondary | ICD-10-CM | POA: Diagnosis present

## 2012-11-16 DIAGNOSIS — Z981 Arthrodesis status: Secondary | ICD-10-CM

## 2012-11-16 HISTORY — PX: ANTERIOR CERVICAL DECOMPRESSION/DISCECTOMY FUSION 4 LEVEL/HARDWARE REMOVAL: SHX5551

## 2012-11-16 SURGERY — ANTERIOR CERVICAL DECOMPRESSION/DISCECTOMY FUSION 4 LEVEL/HARDWARE REMOVAL
Anesthesia: General | Site: Neck | Wound class: Clean

## 2012-11-16 MED ORDER — GLYCOPYRROLATE 0.2 MG/ML IJ SOLN
INTRAMUSCULAR | Status: DC | PRN
  Administered 2012-11-16: 0.1 mg via INTRAVENOUS
  Administered 2012-11-16: .7 mg via INTRAVENOUS

## 2012-11-16 MED ORDER — THROMBIN 20000 UNITS EX SOLR
CUTANEOUS | Status: DC | PRN
  Administered 2012-11-16: 09:00:00 via TOPICAL

## 2012-11-16 MED ORDER — THROMBIN 5000 UNITS EX SOLR
OROMUCOSAL | Status: DC | PRN
  Administered 2012-11-16: 09:00:00 via TOPICAL

## 2012-11-16 MED ORDER — LORATADINE 10 MG PO TABS
10.0000 mg | ORAL_TABLET | Freq: Every day | ORAL | Status: DC
  Administered 2012-11-18: 10 mg via ORAL
  Filled 2012-11-16 (×2): qty 1

## 2012-11-16 MED ORDER — DEXAMETHASONE SODIUM PHOSPHATE 4 MG/ML IJ SOLN
4.0000 mg | Freq: Four times a day (QID) | INTRAMUSCULAR | Status: DC
Start: 2012-11-16 — End: 2012-11-18
  Administered 2012-11-16 – 2012-11-17 (×3): 4 mg via INTRAVENOUS
  Filled 2012-11-16 (×10): qty 1

## 2012-11-16 MED ORDER — LACTATED RINGERS IV SOLN
INTRAVENOUS | Status: DC | PRN
  Administered 2012-11-16 (×3): via INTRAVENOUS

## 2012-11-16 MED ORDER — HYDROMORPHONE HCL PF 1 MG/ML IJ SOLN
0.2500 mg | INTRAMUSCULAR | Status: DC | PRN
  Administered 2012-11-16 (×2): 0.5 mg via INTRAVENOUS

## 2012-11-16 MED ORDER — OXYCODONE HCL 5 MG PO TABS: 5.0000 mg | ORAL_TABLET | Freq: Once | ORAL | Status: DC | PRN

## 2012-11-16 MED ORDER — ESCITALOPRAM OXALATE 10 MG PO TABS
10.0000 mg | ORAL_TABLET | Freq: Every day | ORAL | Status: DC
  Administered 2012-11-17 – 2012-11-18 (×2): 10 mg via ORAL
  Filled 2012-11-16 (×4): qty 1

## 2012-11-16 MED ORDER — OXYCODONE-ACETAMINOPHEN 5-325 MG PO TABS
1.0000 | ORAL_TABLET | ORAL | Status: DC | PRN
  Administered 2012-11-16: 2 via ORAL
  Administered 2012-11-17 – 2012-11-18 (×4): 1 via ORAL
  Filled 2012-11-16: qty 2
  Filled 2012-11-16 (×4): qty 1
  Filled 2012-11-16: qty 2

## 2012-11-16 MED ORDER — SODIUM CHLORIDE 0.9 % IV SOLN: 250.0000 mL | INTRAVENOUS | Status: DC

## 2012-11-16 MED ORDER — HYDROMORPHONE HCL PF 1 MG/ML IJ SOLN
INTRAMUSCULAR | Status: DC | PRN
  Administered 2012-11-16 (×2): 0.5 mg via INTRAVENOUS

## 2012-11-16 MED ORDER — VECURONIUM BROMIDE 10 MG IV SOLR
INTRAVENOUS | Status: DC | PRN
  Administered 2012-11-16: 3 mg via INTRAVENOUS

## 2012-11-16 MED ORDER — DIPHENHYDRAMINE HCL 12.5 MG/5ML PO ELIX: 12.5000 mg | ORAL_SOLUTION | Freq: Four times a day (QID) | ORAL | Status: DC | PRN

## 2012-11-16 MED ORDER — DIAZEPAM 5 MG PO TABS
5.0000 mg | ORAL_TABLET | Freq: Four times a day (QID) | ORAL | Status: DC | PRN
  Administered 2012-11-17 – 2012-11-18 (×3): 5 mg via ORAL
  Filled 2012-11-16 (×3): qty 1

## 2012-11-16 MED ORDER — DEXAMETHASONE 4 MG PO TABS
4.0000 mg | ORAL_TABLET | Freq: Four times a day (QID) | ORAL | Status: DC
  Administered 2012-11-16 – 2012-11-18 (×6): 4 mg via ORAL
  Filled 2012-11-16 (×12): qty 1

## 2012-11-16 MED ORDER — ONDANSETRON HCL 4 MG/2ML IJ SOLN: 4.0000 mg | Freq: Four times a day (QID) | INTRAMUSCULAR | Status: DC | PRN

## 2012-11-16 MED ORDER — NEOSTIGMINE METHYLSULFATE 1 MG/ML IJ SOLN
INTRAMUSCULAR | Status: DC | PRN
  Administered 2012-11-16: 3.5 mg via INTRAVENOUS

## 2012-11-16 MED ORDER — PROPOFOL 10 MG/ML IV BOLUS
INTRAVENOUS | Status: DC | PRN
  Administered 2012-11-16: 160 mg via INTRAVENOUS

## 2012-11-16 MED ORDER — EPHEDRINE SULFATE 50 MG/ML IJ SOLN
INTRAMUSCULAR | Status: DC | PRN
  Administered 2012-11-16 (×2): 10 mg via INTRAVENOUS

## 2012-11-16 MED ORDER — METOCLOPRAMIDE HCL 5 MG/ML IJ SOLN
INTRAMUSCULAR | Status: DC | PRN
  Administered 2012-11-16: 5 mg via INTRAVENOUS

## 2012-11-16 MED ORDER — NALOXONE HCL 0.4 MG/ML IJ SOLN: 0.4000 mg | INTRAMUSCULAR | Status: DC | PRN

## 2012-11-16 MED ORDER — LIDOCAINE HCL (CARDIAC) 20 MG/ML IV SOLN
INTRAVENOUS | Status: DC | PRN
  Administered 2012-11-16: 100 mg via INTRAVENOUS

## 2012-11-16 MED ORDER — DIPHENHYDRAMINE HCL 50 MG/ML IJ SOLN: 12.5000 mg | Freq: Four times a day (QID) | INTRAMUSCULAR | Status: DC | PRN

## 2012-11-16 MED ORDER — HYDROMORPHONE 0.3 MG/ML IV SOLN
INTRAVENOUS | Status: DC
  Administered 2012-11-16: 0.9 mg via INTRAVENOUS
  Administered 2012-11-16: 17:00:00 via INTRAVENOUS
  Administered 2012-11-17: 0.9 mg via INTRAVENOUS
  Administered 2012-11-17: 0.39 mg via INTRAVENOUS
  Filled 2012-11-16: qty 25

## 2012-11-16 MED ORDER — SODIUM CHLORIDE 0.9 % IJ SOLN
3.0000 mL | Freq: Two times a day (BID) | INTRAMUSCULAR | Status: DC
  Administered 2012-11-17 – 2012-11-18 (×3): 3 mL via INTRAVENOUS

## 2012-11-16 MED ORDER — ONDANSETRON HCL 4 MG/2ML IJ SOLN
4.0000 mg | INTRAMUSCULAR | Status: DC | PRN
  Administered 2012-11-16: 4 mg via INTRAVENOUS
  Filled 2012-11-16: qty 2

## 2012-11-16 MED ORDER — ACETAMINOPHEN 325 MG PO TABS: 650.0000 mg | ORAL_TABLET | ORAL | Status: DC | PRN

## 2012-11-16 MED ORDER — SODIUM CHLORIDE 0.9 % IV SOLN
INTRAVENOUS | Status: DC
  Administered 2012-11-16 – 2012-11-17 (×2): via INTRAVENOUS

## 2012-11-16 MED ORDER — ZOLPIDEM TARTRATE 5 MG PO TABS: 5.0000 mg | ORAL_TABLET | Freq: Every evening | ORAL | Status: DC | PRN

## 2012-11-16 MED ORDER — SODIUM CHLORIDE 0.9 % IJ SOLN: 9.0000 mL | INTRAMUSCULAR | Status: DC | PRN

## 2012-11-16 MED ORDER — OXYCODONE HCL 5 MG/5ML PO SOLN: 5.0000 mg | Freq: Once | ORAL | Status: DC | PRN

## 2012-11-16 MED ORDER — FENTANYL CITRATE 0.05 MG/ML IJ SOLN
INTRAMUSCULAR | Status: DC | PRN
  Administered 2012-11-16 (×4): 50 ug via INTRAVENOUS
  Administered 2012-11-16: 100 ug via INTRAVENOUS
  Administered 2012-11-16 (×4): 50 ug via INTRAVENOUS

## 2012-11-16 MED ORDER — ARTIFICIAL TEARS OP OINT
TOPICAL_OINTMENT | OPHTHALMIC | Status: DC | PRN
  Administered 2012-11-16: 1 via OPHTHALMIC

## 2012-11-16 MED ORDER — ACETAMINOPHEN 650 MG RE SUPP: 650.0000 mg | RECTAL | Status: DC | PRN

## 2012-11-16 MED ORDER — PHENOL 1.4 % MT LIQD: 1.0000 | OROMUCOSAL | Status: DC | PRN

## 2012-11-16 MED ORDER — DEXAMETHASONE SODIUM PHOSPHATE 4 MG/ML IJ SOLN
INTRAMUSCULAR | Status: DC | PRN
  Administered 2012-11-16: 8 mg via INTRAVENOUS

## 2012-11-16 MED ORDER — MORPHINE SULFATE 2 MG/ML IJ SOLN
1.0000 mg | INTRAMUSCULAR | Status: DC | PRN
  Filled 2012-11-16: qty 1

## 2012-11-16 MED ORDER — MIDAZOLAM HCL 5 MG/5ML IJ SOLN
INTRAMUSCULAR | Status: DC | PRN
  Administered 2012-11-16 (×2): 2 mg via INTRAVENOUS

## 2012-11-16 MED ORDER — PROMETHAZINE HCL 25 MG/ML IJ SOLN: 6.2500 mg | INTRAMUSCULAR | Status: DC | PRN

## 2012-11-16 MED ORDER — ONDANSETRON HCL 4 MG/2ML IJ SOLN
INTRAMUSCULAR | Status: DC | PRN
  Administered 2012-11-16 (×2): 4 mg via INTRAVENOUS

## 2012-11-16 MED ORDER — CEFAZOLIN SODIUM 1-5 GM-% IV SOLN
1.0000 g | Freq: Three times a day (TID) | INTRAVENOUS | Status: AC
  Administered 2012-11-16 – 2012-11-17 (×2): 1 g via INTRAVENOUS
  Filled 2012-11-16 (×2): qty 50

## 2012-11-16 MED ORDER — ROCURONIUM BROMIDE 100 MG/10ML IV SOLN
INTRAVENOUS | Status: DC | PRN
  Administered 2012-11-16: 50 mg via INTRAVENOUS

## 2012-11-16 MED ORDER — 0.9 % SODIUM CHLORIDE (POUR BTL) OPTIME
TOPICAL | Status: DC | PRN
  Administered 2012-11-16: 1000 mL

## 2012-11-16 MED ORDER — INFLUENZA VAC SPLIT QUAD 0.5 ML IM SUSP
0.5000 mL | INTRAMUSCULAR | Status: AC
  Filled 2012-11-16: qty 0.5

## 2012-11-16 MED ORDER — SODIUM CHLORIDE 0.9 % IJ SOLN: 3.0000 mL | INTRAMUSCULAR | Status: DC | PRN

## 2012-11-16 MED ORDER — MENTHOL 3 MG MT LOZG: 1.0000 | LOZENGE | OROMUCOSAL | Status: DC | PRN

## 2012-11-16 MED ORDER — HYDROMORPHONE HCL PF 1 MG/ML IJ SOLN
INTRAMUSCULAR | Status: AC
  Administered 2012-11-16: 0.5 mg via INTRAVENOUS
  Filled 2012-11-16: qty 1

## 2012-11-16 SURGICAL SUPPLY — 57 items
APL SKNCLS STERI-STRIP NONHPOA (GAUZE/BANDAGES/DRESSINGS) ×1
BANDAGE GAUZE ELAST BULKY 4 IN (GAUZE/BANDAGES/DRESSINGS) ×4 IMPLANT
BENZOIN TINCTURE PRP APPL 2/3 (GAUZE/BANDAGES/DRESSINGS) ×2 IMPLANT
BIT DRILL SM SPINE QC 12 (BIT) ×1 IMPLANT
BLADE ULTRA TIP 2M (BLADE) ×2 IMPLANT
BUR BARREL STRAIGHT FLUTE 4.0 (BURR) IMPLANT
BUR MATCHSTICK NEURO 3.0 LAGG (BURR) ×2 IMPLANT
CANISTER SUCTION 2500CC (MISCELLANEOUS) ×2 IMPLANT
CLOTH BEACON ORANGE TIMEOUT ST (SAFETY) ×2 IMPLANT
CONT SPEC 4OZ CLIKSEAL STRL BL (MISCELLANEOUS) ×2 IMPLANT
COVER MAYO STAND STRL (DRAPES) ×2 IMPLANT
DRAPE C-ARM 42X72 X-RAY (DRAPES) ×4 IMPLANT
DRAPE LAPAROTOMY 100X72 PEDS (DRAPES) ×2 IMPLANT
DRAPE MICROSCOPE LEICA (MISCELLANEOUS) ×2 IMPLANT
DRAPE POUCH INSTRU U-SHP 10X18 (DRAPES) ×2 IMPLANT
DRAPE PROXIMA HALF (DRAPES) ×3 IMPLANT
DURAPREP 6ML APPLICATOR 50/CS (WOUND CARE) ×2 IMPLANT
ELECT REM PT RETURN 9FT ADLT (ELECTROSURGICAL) ×2
ELECTRODE REM PT RTRN 9FT ADLT (ELECTROSURGICAL) ×1 IMPLANT
GAUZE SPONGE 4X4 16PLY XRAY LF (GAUZE/BANDAGES/DRESSINGS) IMPLANT
GLOVE BIOGEL M 8.0 STRL (GLOVE) ×1 IMPLANT
GLOVE BIOGEL PI IND STRL 8 (GLOVE) IMPLANT
GLOVE BIOGEL PI INDICATOR 8 (GLOVE) ×2
GLOVE EXAM NITRILE LRG STRL (GLOVE) ×1 IMPLANT
GLOVE EXAM NITRILE MD LF STRL (GLOVE) IMPLANT
GLOVE EXAM NITRILE XL STR (GLOVE) IMPLANT
GLOVE EXAM NITRILE XS STR PU (GLOVE) IMPLANT
GLOVE SURG SS PI 7.5 STRL IVOR (GLOVE) ×3 IMPLANT
GOWN BRE IMP SLV AUR LG STRL (GOWN DISPOSABLE) ×2 IMPLANT
GOWN BRE IMP SLV AUR XL STRL (GOWN DISPOSABLE) ×1 IMPLANT
GOWN STRL REIN 2XL LVL4 (GOWN DISPOSABLE) ×2 IMPLANT
HEMOSTAT POWDER KIT SURGIFOAM (HEMOSTASIS) IMPLANT
KIT BASIN OR (CUSTOM PROCEDURE TRAY) ×2 IMPLANT
KIT ROOM TURNOVER OR (KITS) ×2 IMPLANT
NDL SPNL 22GX3.5 QUINCKE BK (NEEDLE) ×1 IMPLANT
NEEDLE SPNL 22GX3.5 QUINCKE BK (NEEDLE) ×2 IMPLANT
NS IRRIG 1000ML POUR BTL (IV SOLUTION) ×2 IMPLANT
PACK LAMINECTOMY NEURO (CUSTOM PROCEDURE TRAY) ×2 IMPLANT
PATTIES SURGICAL .5 X1 (DISPOSABLE) ×2 IMPLANT
PLATE ANT CERV XTEND 4 LV 72 (Plate) ×1 IMPLANT
PUTTY DBX 1CC (Putty) ×2 IMPLANT
PUTTY DBX 1CC DEPUY (Putty) IMPLANT
RUBBERBAND STERILE (MISCELLANEOUS) ×4 IMPLANT
SCREW SELF TAP VARIABLE 4.6X12 (Screw) ×1 IMPLANT
SCREW XTD VAR 4.2 SELF TAP 12 (Screw) ×8 IMPLANT
SPACER COLONIAL SZ 6-7 (Spacer) ×1 IMPLANT
SPONGE GAUZE 4X4 12PLY (GAUZE/BANDAGES/DRESSINGS) ×2 IMPLANT
SPONGE INTESTINAL PEANUT (DISPOSABLE) ×4 IMPLANT
SPONGE SURGIFOAM ABS GEL 100 (HEMOSTASIS) ×2 IMPLANT
STRIP CLOSURE SKIN 1/2X4 (GAUZE/BANDAGES/DRESSINGS) ×2 IMPLANT
SUT VIC AB 3-0 SH 8-18 (SUTURE) ×2 IMPLANT
SYR 20ML ECCENTRIC (SYRINGE) ×2 IMPLANT
TAPE CLOTH SURG 4X10 WHT LF (GAUZE/BANDAGES/DRESSINGS) ×1 IMPLANT
TOWEL OR 17X24 6PK STRL BLUE (TOWEL DISPOSABLE) ×2 IMPLANT
TOWEL OR 17X26 10 PK STRL BLUE (TOWEL DISPOSABLE) ×2 IMPLANT
TRAY FOLEY BAG SILVER LF 14FR (CATHETERS) ×1 IMPLANT
WATER STERILE IRR 1000ML POUR (IV SOLUTION) ×2 IMPLANT

## 2012-11-16 NOTE — Preoperative (Signed)
Beta Blockers   Reason not to administer Beta Blockers:Not Applicable 

## 2012-11-16 NOTE — Anesthesia Preprocedure Evaluation (Addendum)
Anesthesia Evaluation  Patient identified by MRN, date of birth, ID band Patient awake    Reviewed: Allergy & Precautions, H&P , NPO status   History of Anesthesia Complications (+) PONV  Airway Mallampati: I  Neck ROM: Limited    Dental  (+) Teeth Intact   Pulmonary asthma ,  breath sounds clear to auscultation        Cardiovascular negative cardio ROS  Rhythm:Regular Rate:Normal     Neuro/Psych  Headaches,    GI/Hepatic negative GI ROS, Neg liver ROS,   Endo/Other  negative endocrine ROS  Renal/GU negative Renal ROS     Musculoskeletal  (+) Arthritis -,   Abdominal   Peds  Hematology negative hematology ROS (+)   Anesthesia Other Findings   Reproductive/Obstetrics                          Anesthesia Physical Anesthesia Plan  ASA: II  Anesthesia Plan: General   Post-op Pain Management:    Induction: Intravenous  Airway Management Planned: Oral ETT  Additional Equipment:   Intra-op Plan:   Post-operative Plan: Extubation in OR  Informed Consent: I have reviewed the patients History and Physical, chart, labs and discussed the procedure including the risks, benefits and alternatives for the proposed anesthesia with the patient or authorized representative who has indicated his/her understanding and acceptance.   Dental advisory given  Plan Discussed with: Surgeon and CRNA  Anesthesia Plan Comments:         Anesthesia Quick Evaluation

## 2012-11-16 NOTE — Progress Notes (Signed)
Patient ID: Kaitlyn Carter, female   DOB: August 24, 1965, 47 y.o.   MRN: [id number] Doing well. Pain between shoulders. Dilaudid helping

## 2012-11-16 NOTE — Transfer of Care (Signed)
Immediate Anesthesia Transfer of Care Note  Patient: Kaitlyn Carter  Procedure(s) Performed: Procedure(s) with comments: Cervical Three-Four, Cervical Four-Five, Cervical Six-Seven, Cervical Seven-Thoracic One Anterior cervical decompression/diskectomy/fusion/possible removal of plate at Z6-1 (N/A) - ANTERIOR CERVICAL DECOMPRESSION/DISCECTOMY FUSION 4 LEVEL/HARDWARE REMOVAL  Patient Location: PACU  Anesthesia Type:General  Level of Consciousness: awake, alert  and oriented  Airway & Oxygen Therapy: Patient Spontanous Breathing and Patient connected to nasal cannula oxygen  Post-op Assessment: Report given to PACU RN, Post -op Vital signs reviewed and stable and Patient moving all extremities X 4  Post vital signs: Reviewed and stable  Complications: No apparent anesthesia complications

## 2012-11-16 NOTE — Progress Notes (Signed)
Utilization Review Completed.Kaitlyn Carter T9/19/2014  

## 2012-11-16 NOTE — Anesthesia Postprocedure Evaluation (Signed)
  Anesthesia Post-op Note  Patient: Kaitlyn Carter  Procedure(s) Performed: Procedure(s) with comments: Cervical Three-Four, Cervical Four-Five, Cervical Six-Seven, Cervical Seven-Thoracic One Anterior cervical decompression/diskectomy/fusion/possible removal of plate at Q0-3 (N/A) - ANTERIOR CERVICAL DECOMPRESSION/DISCECTOMY FUSION 4 LEVEL/HARDWARE REMOVAL  Patient Location: PACU  Anesthesia Type:General  Level of Consciousness: awake and alert   Airway and Oxygen Therapy: Patient Spontanous Breathing  Post-op Pain: mild  Post-op Assessment: Post-op Vital signs reviewed  Post-op Vital Signs: stable  Complications: No apparent anesthesia complications

## 2012-11-16 NOTE — Anesthesia Procedure Notes (Signed)
Procedure Name: Intubation Date/Time: 11/16/2012 8:09 AM Performed by: Margaree Mackintosh Pre-anesthesia Checklist: Patient identified, Timeout performed, Emergency Drugs available, Suction available and Patient being monitored Patient Re-evaluated:Patient Re-evaluated prior to inductionOxygen Delivery Method: Circle system utilized Preoxygenation: Pre-oxygenation with 100% oxygen Intubation Type: IV induction Ventilation: Mask ventilation without difficulty Laryngoscope size: Glidescope. Grade View: Grade I Tube type: Oral Tube size: 7.0 mm Number of attempts: 1 Airway Equipment and Method: Stylet and Video-laryngoscopy Placement Confirmation: ETT inserted through vocal cords under direct vision,  positive ETCO2 and breath sounds checked- equal and bilateral Secured at: 20 cm Tube secured with: Tape Dental Injury: Teeth and Oropharynx as per pre-operative assessment

## 2012-11-16 NOTE — Progress Notes (Signed)
Op note  [phone]

## 2012-11-17 NOTE — Evaluation (Signed)
Occupational Therapy Evaluation Patient Details Name: Kaitlyn Carter MRN: [id number] DOB: April 04, 1965 Today's Date: 11/17/2012 Time: 1026-1040 OT Time Calculation (min): 14 min  OT Assessment / Plan / Recommendation History of present illness 47 yo female s/p ACDF C3-7   Clinical Impression   Patient evaluated by Occupational Therapy with no further acute OT needs identified. All education has been completed and the patient has no further questions. See below for any follow-up Occupational Therapy or equipment needs. OT to sign off. Thank you for referral.      OT Assessment  Patient does not need any further OT services    Follow Up Recommendations  No OT follow up    Barriers to Discharge      Equipment Recommendations  None recommended by OT    Recommendations for Other Services    Frequency       Precautions / Restrictions Precautions Precautions: Cervical   Pertinent Vitals/Pain Minimal pain Reports "my hands are working"    ADL  Eating/Feeding: Independent Where Assessed - Eating/Feeding: Chair Grooming: Teeth care;Wash/dry face;Independent Where Assessed - Grooming: Unsupported standing Lower Body Dressing: Modified independent Where Assessed - Lower Body Dressing: Unsupported sit to stand Toilet Transfer: Modified independent Toilet Transfer Method: Sit to Barista: Regular height toilet Tub/Shower Transfer: Modified independent Tub/Shower Transfer Method: Science writer: Walk in shower Equipment Used: Gait belt Transfers/Ambulation Related to ADLs: pt ambulating without deficits.  ADL Comments: pT demonstrates ability to close eyes for 30 seconds without LOB showing ability to get into stand under shower without deficits. Pt with handout and all education complete  Demonstrates ability to open tooth brush, tooth paste and remove very small tin protective first time use cover on tooth paste (fine motor)  OT  Diagnosis:    OT Problem List:   OT Treatment Interventions:     OT Goals(Current goals can be found in the care plan section)    Visit Information  Last OT Received On: 11/17/12 Assistance Needed: +1 History of Present Illness: 47 yo female s/p ACDF C3-7       Prior Functioning     Home Living Family/patient expects to be discharged to:: Private residence Living Arrangements: Alone;Children Available Help at Discharge: Family (husband 38 year old and 12 year old) Type of Home: House Home Access: Stairs to enter Secretary/administrator of Steps: 2 Entrance Stairs-Rails: Can reach both Home Layout: One level Home Equipment: Shower seat - built in Prior Function Level of Independence: Independent Communication Communication: No difficulties Dominant Hand: Right         Vision/Perception Vision - History Baseline Vision: No visual deficits Patient Visual Report: No change from baseline   Cognition  Cognition Arousal/Alertness: Awake/alert Behavior During Therapy: WFL for tasks assessed/performed Overall Cognitive Status: Within Functional Limits for tasks assessed    Extremity/Trunk Assessment Upper Extremity Assessment Upper Extremity Assessment: Generalized weakness Lower Extremity Assessment Lower Extremity Assessment: Defer to PT evaluation Cervical / Trunk Assessment Cervical / Trunk Assessment:  (s/p surg)     Mobility Bed Mobility Bed Mobility: Supine to Sit;Rolling Right;Right Sidelying to Sit Rolling Right: 5: Supervision;With rail Right Sidelying to Sit: 5: Supervision;With rails Supine to Sit: 5: Supervision;With rails Details for Bed Mobility Assistance: educated on log roll sequence Transfers Transfers: Sit to Stand;Stand to Sit Sit to Stand: 6: Modified independent (Device/Increase time);With upper extremity assist;From bed Stand to Sit: 6: Modified independent (Device/Increase time);With upper extremity assist;To chair/3-in-1     Exercise  Balance     End of Session OT - End of Session Activity Tolerance: Patient tolerated treatment well Patient left: in chair;with call bell/phone within reach;with family/visitor present Nurse Communication: Mobility status;Precautions  GO     Harolyn Rutherford 11/17/2012, 10:46 AM Pager: [phone]

## 2012-11-17 NOTE — Progress Notes (Signed)
Filed Vitals:   11/17/12 0203 11/17/12 0350 11/17/12 0527 11/17/12 0748  BP: 119/78  125/76   Pulse: 98  93   Temp: 98.3 F (36.8 C)  98.3 F (36.8 C)   TempSrc: Oral  Oral   Resp: 15 12 17 20   SpO2: 98% 98% 100% 100%    Patient resting in bed, has been out of bed to the bathroom. Continues on PCA. Dressing clean and dry, small amount of serosanguineous drainage into Jackson-Pratt drain.  Plan: We'll DC PCA, and change IV saline lock. Have encouraged beginning ambulation in the halls, and steadily increasing through the day.  Hewitt Shorts, MD 11/17/2012, 9:48 AM

## 2012-11-17 NOTE — Evaluation (Signed)
Physical Therapy Evaluation Patient Details Name: Kaitlyn Carter MRN: [id number] DOB: 1965-05-04 Today's Date: 11/17/2012 Time: 9563-8756 PT Time Calculation (min): 18 min  PT Assessment / Plan / Recommendation History of Present Illness  47 yo female s/p ACDF C3-7  Clinical Impression  Patient evaluated by Physical Therapy with no further acute PT needs identified. All education has been completed and the patient has no further questions. See below for any follow-up Physial Therapy or equipment needs. PT is signing off. Thank you for this referral.     PT Assessment  Patent does not need any further PT services    Follow Up Recommendations  No PT follow up;Supervision - Intermittent    Does the patient have the potential to tolerate intense rehabilitation      Barriers to Discharge        Equipment Recommendations  None recommended by PT    Recommendations for Other Services     Frequency      Precautions / Restrictions Precautions Precautions: Cervical Precaution Comments: pt able to verbalize and handout provided Required Braces or Orthoses:  (no brace)   Pertinent Vitals/Pain Reports minimal incisional pain       Mobility  Bed Mobility Bed Mobility: Rolling Right;Right Sidelying to Sit;Sitting - Scoot to Edge of Bed Rolling Right: 5: Supervision;With rail Right Sidelying to Sit: 5: Supervision;With rails Supine to Sit: 5: Supervision;With rails Sitting - Scoot to Edge of Bed: 7: Independent Details for Bed Mobility Assistance: educated on positioning for sleeping on her side (pt's request) and maintaining neck precautions with bed mobility Transfers Transfers: Sit to Stand;Stand to Sit Sit to Stand: 7: Independent Stand to Sit: 7: Independent Ambulation/Gait Ambulation/Gait Assistance: 5: Supervision Ambulation Distance (Feet): 150 Feet Assistive device: None Ambulation/Gait Assistance Details: no deviations; able to significantly alter velocity up and  down, perform quick stops, turns Gait Pattern: Within Functional Limits Stairs: No    Exercises     PT Diagnosis:    PT Problem List:   PT Treatment Interventions:       PT Goals(Current goals can be found in the care plan section) Acute Rehab PT Goals PT Goal Formulation: No goals set, d/c therapy  Visit Information  Last PT Received On: 11/17/12 Assistance Needed: +1 PT/OT Co-Evaluation/Treatment: Yes History of Present Illness: 47 yo female s/p ACDF C3-7       Prior Functioning  Home Living Family/patient expects to be discharged to:: Private residence Living Arrangements: Alone;Children Available Help at Discharge: Family;Available 24 hours/day Type of Home: House Home Access: Stairs to enter [employer] of Steps: 2 Entrance Stairs-Rails: Can reach both Home Layout: One level Home Equipment: Shower seat - built in Prior Function Level of Independence: Independent Comments: noted she was having problems with dexterity and dropping items Communication Communication: No difficulties Dominant Hand: Right    Cognition  Cognition Arousal/Alertness: Awake/alert Behavior During Therapy: WFL for tasks assessed/performed Overall Cognitive Status: Within Functional Limits for tasks assessed    Extremity/Trunk Assessment Upper Extremity Assessment Upper Extremity Assessment: Defer to OT evaluation Lower Extremity Assessment Lower Extremity Assessment: Overall WFL for tasks assessed Cervical / Trunk Assessment Cervical / Trunk Assessment: Normal   Balance Balance Balance Assessed: Yes Static Standing Balance Static Standing - Balance Support: No upper extremity supported Static Standing - Level of Assistance: 7: Independent Rhomberg - Eyes Opened: 30 Rhomberg - Eyes Closed: 30  End of Session PT - End of Session Equipment Utilized During Treatment: Gait belt Activity Tolerance: Patient tolerated treatment  well Patient left: in chair;with call  bell/phone within reach;with family/visitor present  GP     Umar Patmon 11/17/2012, 1:14 PM Pager [phone]

## 2012-11-17 NOTE — Op Note (Signed)
NAMESULLIVAN, BLASING NO.:  [id number]  MEDICAL RECORD NO.:  [id number]  LOCATION:  [id number]                        FACILITY:  [facility]  PHYSICIAN:  Hilda Lias, M.D.   DATE OF BIRTH:  1965-04-30  DATE OF PROCEDURE:  11/16/2012 DATE OF DISCHARGE:                              OPERATIVE REPORT   PREOPERATIVE DIAGNOSIS:  Cervical stenosis, C3-4, 4-5, 6-7, status post fusion 5-6, foraminal narrowing right C7-T1.  POSTOPERATIVE DIAGNOSIS:  Cervical stenosis, C3-4, 4-5, 6-7, status post fusion 5-6, foraminal narrowing right C7-T1.  PROCEDURE:  Removal of the anterior plate of 5-6.  Decompression, diskectomy at the level of 3-4, 4-5, and 6-7.  Interbody fusion with cage, plate, microscope.  SURGEON:  Hilda Lias, M.D.  ASSISTANT:  Tia Alert, MD  CLINICAL HISTORY:  Ms. Kidney is a lady who many years ago had surgery at level of 5-6 in [facility].  I saw this lady back in April this year, and later on several days ago complaining of pain in her arm with radiation to both upper extremities associated with weakness.  X-rays show severe degenerative disk disease at the level of 3-4 with spondylolisthesis at the level of 4-5 and 6-7.  At the level of C7-T1, she had mostly narrowing to the foramen to the right side.  She has no pain along the C8 nerve root.  Surgery was advised.  I talked to her about the procedure and the possibility including the C7-T1 to allow her more morbidity especially because clinically she wants no compromise. The patient knew the risk and benefit with surgery.  PROCEDURE:  The patient was taken to the OR, and the left side of the neck was cleaned with DuraPrep.  Drapes were applied.  Transverse incision following the previous one was made through the skin, subcutaneous tissue, through the platysma.  The patient had quite a bit of fibrosis, lysis was accomplished.  With the help of the microscope, we were able to remove the scar tissue.   I went from the plate and be able to clean the area at the level of 3-4, 4-5, and 6-7.  At the end, we had good visualization of the operative field.  Then, we proceeded first at the level of 4-5 opening the anterior ligament, which was calcified.  We drilled all the way posterior to the posterior ligament. The posterior ligament was opened in the midline and decompression of the spinal cord centrally and laterally into the foramen was achieved. At the level of C3-4, the area was quite mobile.  The ligament was opened anteriorly, diskectomy was accomplished, and decompression of the spinal cord as well as the C4 nerve root was achieved.  6-7 same finding with severe degenerative disk disease, with decompression of the cord and the C7 nerve root.  Although we have better visualization of the C7- T1, I decided not to involve this area.  Then, the plate were drilled. At the level of 3-4, I introduced a cage 6 mm height, lordotic with autograft and DBX.  At the level of 4-5 was 7 mm and so at the level of C6-7.  Lateral cervical spine x-ray showed good position  of the cages. Then using plate from Z6-X0 using screws was helped to stabilize the area.  Lateral cervical spine x-ray showed good position of the plate from C3 down to C7.  The area was irrigated.  Hemostasis was accomplished.  Although we have a good hemostasis, we left a drain because of the scar dissection. Then, the wound was closed with Vicryl and Steri-Strips.          ______________________________ Hilda Lias, M.D.     EB/MEDQ  D:  11/16/2012  T:  11/17/2012  Job:  [id number]

## 2012-11-17 NOTE — Progress Notes (Signed)
Patient up and moving well, walked several times in the hall ways.tolerated well. Stable now.

## 2012-11-18 MED ORDER — DIAZEPAM 5 MG PO TABS: 5.0000 mg | ORAL_TABLET | Freq: Three times a day (TID) | ORAL | Status: DC | PRN

## 2012-11-18 MED ORDER — POLYETHYLENE GLYCOL 3350 17 G PO PACK
17.0000 g | PACK | Freq: Every day | ORAL | Status: DC
  Administered 2012-11-18: 17 g via ORAL
  Filled 2012-11-18: qty 1

## 2012-11-18 MED ORDER — OXYCODONE-ACETAMINOPHEN 5-325 MG PO TABS: 1.0000 | ORAL_TABLET | ORAL | Status: DC | PRN

## 2012-11-18 NOTE — Discharge Summary (Signed)
Physician Discharge Summary  Patient ID: Kaitlyn Carter MRN: [id number] DOB/AGE: 08-22-1965 47 y.o.  Admit date: 11/16/2012 Discharge date: 11/18/2012  Admission Diagnoses: cervical spondylosis   Discharge Diagnoses: same   Discharged Condition: good  Hospital Course: The patient was admitted on 11/16/2012 and taken to the operating room where the patient underwent ACDF. The patient tolerated the procedure well and was taken to the recovery room and then to the floor in stable condition. The hospital course was routine. There were no complications. The wound remained clean dry and intact. Pt had appropriate neck soreness. No complaints of arm pain or new N/T/W. The patient remained afebrile with stable vital signs, and tolerated a regular diet. The patient continued to increase activities, and pain was well controlled with oral pain medications.   Consults: None  Significant Diagnostic Studies:  Results for orders placed during the hospital encounter of 11/14/12  SURGICAL PCR SCREEN      Result Value Range   MRSA, PCR NEGATIVE  NEGATIVE   Staphylococcus aureus NEGATIVE  NEGATIVE  BASIC METABOLIC PANEL      Result Value Range   Sodium 137  135 - 145 mEq/L   Potassium 4.2  3.5 - 5.1 mEq/L   Chloride 102  96 - 112 mEq/L   CO2 26  19 - 32 mEq/L   Glucose, Bld 81  70 - 99 mg/dL   BUN 13  6 - 23 mg/dL   Creatinine, Ser 2.95  0.50 - 1.10 mg/dL   Calcium 9.7  8.4 - 28.4 mg/dL   GFR calc non Af Amer >90  >90 mL/min   GFR calc Af Amer >90  >90 mL/min  CBC      Result Value Range   WBC 6.1  4.0 - 10.5 K/uL   RBC 4.52  3.87 - 5.11 MIL/uL   Hemoglobin 13.9  12.0 - 15.0 g/dL   HCT 13.2  44.0 - 10.2 %   MCV 91.4  78.0 - 100.0 fL   MCH 30.8  26.0 - 34.0 pg   MCHC 33.7  30.0 - 36.0 g/dL   RDW 72.5  36.6 - 44.0 %   Platelets 259  150 - 400 K/uL    Dg Cervical Spine Complete  11/16/2012   *RADIOLOGY REPORT*  Clinical Data: Cervical spinal stenosis.  CERVICAL SPINE - COMPLETE 4+ VIEW   Comparison: CT myelogram dated 10/22/2012  Findings: Radiograph #1 demonstrates instruments at C3-4 and at C6- 7.  Radiograph #2 demonstrates a plate extending from the inferior aspect of C3 throughC7. Interbody fusion devices present at C3-4, C4-5, and C6-7 are new.  Radiograph #3 demonstrates screws have been inserted at C3, C4, C6 and C7.  Radiograph #4 demonstrates additional screws at C5.  IMPRESSION: Anterior cervical fusion and revision performed from C3 through C7.   Original Report Authenticated By: Francene Boyers, M.D.   Ct Cervical Spine W Contrast  10/22/2012   *RADIOLOGY REPORT*  Clinical Data: Cervical radiculopathy.  Adjacent segment disease. C5-C6 fusion 10 years ago.  Bilateral upper extremity radicular pain.  LUMBAR PUNCTURE FOR CERVICAL MYELOGRAM   Procedure: After thorough discussion of risks and benefits of the procedure including bleeding, infection, injury to nerves, blood vessels, adjacent structures as well as headache and CSF leak, written and oral informed consent was obtained.   Consent was obtained by Dr.Lamke. We discussed the high likelihood of obtaining a diagnostic study.  Patient was positioned prone on the fluoroscopy table. Local anesthesia was provided with 1% lidocaine  without epinephrine after prepped and draped in the usual sterile fashion. Puncture was performed at L3-L4 using a 3-1/2 inches pencil point 22-gauge spinal needle via right paramedian approach.  Using a single pass through the dura, the needle was placed within the thecal sac, with return of clear CSF. 8 mL of Omnipaque-300 was injected into the thecal sac, with normal opacification of the nerve roots and cauda equina consistent with free flow within the subarachnoid space.  Fluoroscopy time: 1 minute 1 second  I personally performed the lumbar puncture and administered the intrathecal contrast. I also personally supervised acquisition of the myelogram images.   Findings:  C5-C6 fusion appears solid.  There is  retrolisthesis of C4 on C5 with severe disc space collapse.  Findings compatible with adjacent segment disease.  Retrolisthesis measures almost 6 mm. There is associated C4-C5 spinal stenosis.  C3-C4 degenerative disc disease is present with broad-based disc osteophyte complex producing mild to moderate central stenosis. C6-C7 adjacent segment disease is also present, with collapse of the disc space and marginal spurring.  Flexion and extension views were performed.  There is little motion in the lower cervical spine.  In the neutral position, the retrolisthesis of C4 on C5 is probably underestimated, measuring 3 mm.  This decreases to 2 mm, compatible with instability at C4-C5 with flexion.  With extension, retrolisthesis measures 3 mm.  The retrolisthesis of C6 on C7 does not change between flexion and extension.  IMPRESSION:  1.  Technically successful lumbar puncture for cervical myelogram. 2.  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 3.  C3-C4 central stenosis is also present associated with disc osteophyte complex.  See CT below.  4.  Retrolisthesis of C4 on C5 measuring 3 mm with instability. 1.5 mm of excursion with flexion.  CT CERVICAL MYELOGRAM  Technique: Contiguous axial images were obtained through the Cervical spine without infusion. Coronal and sagittal reconstructions were obtained of the axial image sets.  Findings: Straightening of the normal cervical lordosis.  Solid C5- C6 fusion.  Retrolisthesis of C4 on C5 measures 4 mm.  Posterior fossa structures appear within normal limits.  Atlantodental degenerative disease.  C2-C3:  Small central disc protrusion and central osseous ridging is present without cord deformity or significant central stenosis. Foramina patent.  C3-C4:  Mild central stenosis associated with shallow broad-based disc osteophyte complex.  Bilateral uncovertebral spurring with very mild bilateral foraminal encroachment.  There is  mild flattening of the ventral cervical cord associated with disc osteophyte complex.  C4-C5:  Moderate to severe central stenosis is present.  There is a left eccentric broad-based disc osteophyte complex.  AP diameter of the thecal sac is 7 mm.  The left greater than right foraminal stenosis is present associated with uncovertebral spurring.  There is a small locule of gas lateral to the right vertebral body, probably contained within a disc protrusion.  This is unlikely to be within the nerve root sleeve.  C5-C6:  Solid fusion.  No hardware complication.  No recurrent stenosis.  C6-C7: Mild to moderate central stenosis with AP diameter of the thecal sac measuring 8 mm.  There is right greater than left bilateral foraminal stenosis associated with uncovertebral spurring.  Trace retrolisthesis of C6 on C7 is present measuring 2 mm.  This appears degenerative in associated with disc collapse.  C7-T1:  Right foraminal stenosis associated with facet spurring and uncovertebral spurring.  Right facet arthrosis is severe.  Mild central stenosis associated with  facet hypertrophy on the right. The left foramen is patent.  IMPRESSION: 1.  C3-C4 mild central stenosis secondary to broad-based disc osteophyte complex with mild bilateral foraminal encroachment due to uncovertebral spurring. 2.  C4-C5 retrolisthesis with severe adjacent segment degenerative disease.  Moderate to severe central stenosis with flattening of the ventral cervical cord.  Bilateral foraminal stenosis potentially affecting both C5 nerves. 3.  C5-C6 solid fusion without recurrent stenosis.  4.  C6-C7 retrolisthesis measuring 2 mm with mild to moderate central stenosis associated with broad-based disc osteophyte complex.  Severe right and moderate to severe left foraminal stenosis due to uncovertebral spurring. 5.  Severe right C7-T1 facet arthrosis with right foraminal encroachment potentially affecting the right C8 nerve.   Original Report Authenticated  By: Andreas Newport, M.D.   US Transvaginal Non-ob  10/25/2012   SEE PROGRESS NOTE  Dg Myelogram Cervical  10/22/2012   *RADIOLOGY REPORT*  Clinical Data: Cervical radiculopathy.  Adjacent segment disease. C5-C6 fusion 10 years ago.  Bilateral upper extremity radicular pain.  LUMBAR PUNCTURE FOR CERVICAL MYELOGRAM   Procedure: After thorough discussion of risks and benefits of the procedure including bleeding, infection, injury to nerves, blood vessels, adjacent structures as well as headache and CSF leak, written and oral informed consent was obtained.   Consent was obtained by Dr.Lamke. We discussed the high likelihood of obtaining a diagnostic study.  Patient was positioned prone on the fluoroscopy table. Local anesthesia was provided with 1% lidocaine without epinephrine after prepped and draped in the usual sterile fashion. Puncture was performed at L3-L4 using a 3-1/2 inches pencil point 22-gauge spinal needle via right paramedian approach.  Using a single pass through the dura, the needle was placed within the thecal sac, with return of clear CSF. 8 mL of Omnipaque-300 was injected into the thecal sac, with normal opacification of the nerve roots and cauda equina consistent with free flow within the subarachnoid space.  Fluoroscopy time: 1 minute 1 second  I personally performed the lumbar puncture and administered the intrathecal contrast. I also personally supervised acquisition of the myelogram images.   Findings:  C5-C6 fusion appears solid.  There is retrolisthesis of C4 on C5 with severe disc space collapse.  Findings compatible with adjacent segment disease.  Retrolisthesis measures almost 6 mm. There is associated C4-C5 spinal stenosis.  C3-C4 degenerative disc disease is present with broad-based disc osteophyte complex producing mild to moderate central stenosis. C6-C7 adjacent segment disease is also present, with collapse of the disc space and marginal spurring.  Flexion and extension views  were performed.  There is little motion in the lower cervical spine.  In the neutral position, the retrolisthesis of C4 on C5 is probably underestimated, measuring 3 mm.  This decreases to 2 mm, compatible with instability at C4-C5 with flexion.  With extension, retrolisthesis measures 3 mm.  The retrolisthesis of C6 on C7 does not change between flexion and extension.  IMPRESSION:  1.  Technically successful lumbar puncture for cervical myelogram. 2.  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 3.  C3-C4 central stenosis is also present associated with disc osteophyte complex.  See CT below.  4.  Retrolisthesis of C4 on C5 measuring 3 mm with instability. 1.5 mm of excursion with flexion.  CT CERVICAL MYELOGRAM  Technique: Contiguous axial images were obtained through the Cervical spine without infusion. Coronal and sagittal reconstructions were obtained of the axial image sets.  Findings: Straightening of the normal cervical lordosis.  Solid C5- C6 fusion.  Retrolisthesis of C4 on C5 measures 4 mm.  Posterior fossa structures appear within normal limits.  Atlantodental degenerative disease.  C2-C3:  Small central disc protrusion and central osseous ridging is present without cord deformity or significant central stenosis. Foramina patent.  C3-C4:  Mild central stenosis associated with shallow broad-based disc osteophyte complex.  Bilateral uncovertebral spurring with very mild bilateral foraminal encroachment.  There is mild flattening of the ventral cervical cord associated with disc osteophyte complex.  C4-C5:  Moderate to severe central stenosis is present.  There is a left eccentric broad-based disc osteophyte complex.  AP diameter of the thecal sac is 7 mm.  The left greater than right foraminal stenosis is present associated with uncovertebral spurring.  There is a small locule of gas lateral to the right vertebral body, probably contained within a disc  protrusion.  This is unlikely to be within the nerve root sleeve.  C5-C6:  Solid fusion.  No hardware complication.  No recurrent stenosis.  C6-C7: Mild to moderate central stenosis with AP diameter of the thecal sac measuring 8 mm.  There is right greater than left bilateral foraminal stenosis associated with uncovertebral spurring.  Trace retrolisthesis of C6 on C7 is present measuring 2 mm.  This appears degenerative in associated with disc collapse.  C7-T1:  Right foraminal stenosis associated with facet spurring and uncovertebral spurring.  Right facet arthrosis is severe.  Mild central stenosis associated with facet hypertrophy on the right. The left foramen is patent.  IMPRESSION: 1.  C3-C4 mild central stenosis secondary to broad-based disc osteophyte complex with mild bilateral foraminal encroachment due to uncovertebral spurring. 2.  C4-C5 retrolisthesis with severe adjacent segment degenerative disease.  Moderate to severe central stenosis with flattening of the ventral cervical cord.  Bilateral foraminal stenosis potentially affecting both C5 nerves. 3.  C5-C6 solid fusion without recurrent stenosis.  4.  C6-C7 retrolisthesis measuring 2 mm with mild to moderate central stenosis associated with broad-based disc osteophyte complex.  Severe right and moderate to severe left foraminal stenosis due to uncovertebral spurring. 5.  Severe right C7-T1 facet arthrosis with right foraminal encroachment potentially affecting the right C8 nerve.   Original Report Authenticated By: Andreas Newport, M.D.    Antibiotics:  Anti-infectives   Start     Dose/Rate Route Frequency Ordered Stop   11/16/12 1600  ceFAZolin (ANCEF) IVPB 1 g/50 mL premix     1 g 100 mL/hr over 30 Minutes Intravenous Every 8 hours 11/16/12 1331 11/17/12 0035   11/16/12 0600  ceFAZolin (ANCEF) IVPB 2 g/50 mL premix     2 g 100 mL/hr over 30 Minutes Intravenous On call to O.R. 11/15/12 1358 11/16/12 0817      Discharge Exam: Blood  pressure 119/85, pulse 98, temperature 98.6 F (37 C), temperature source Oral, resp. rate 20, SpO2 97.00%. Neurologic: Grossly normal Incision clean dry and intact  Discharge Medications:     Medication List         cetirizine 10 MG tablet  Commonly known as:  ZYRTEC  Take 10 mg by mouth daily.     diazepam 5 MG tablet  Commonly known as:  VALIUM  Take 1 tablet (5 mg total) by mouth every 8 (eight) hours as needed (spasm).     escitalopram 10 MG tablet  Commonly known as:  LEXAPRO  Take 10 mg by mouth daily before breakfast.     oxyCODONE-acetaminophen 5-325 MG per tablet  Commonly known as:  PERCOCET/ROXICET  Take 1-2 tablets by mouth every 4 (four) hours as needed.        Disposition: home   Final Dx: ACDF      Discharge Orders   Future Appointments Provider Department Dept Phone   12/10/2012 9:15 AM Melony Overly, MD [facility] [phone]   01/14/2013 9:30 AM Melony Overly, MD [facility] [phone]   Future Orders Complete By Expires   Call MD for:  difficulty breathing, headache or visual disturbances  As directed    Call MD for:  persistant nausea and vomiting  As directed    Call MD for:  redness, tenderness, or signs of infection (pain, swelling, redness, odor or green/yellow discharge around incision site)  As directed    Call MD for:  severe uncontrolled pain  As directed    Call MD for:  temperature >100.4  As directed    Diet - low sodium heart healthy  As directed    Discharge instructions  As directed    Comments:     No heavy lifting, no driving, may shower normally   Increase activity slowly  As directed       Follow-up Information   Follow up with Karn Cassis, MD. Schedule an appointment as soon as possible for a visit in 2 weeks.   Specialty:  Neurosurgery   Contact information:   [address] [place] [address] [phone]         Signed: Tia Alert 11/18/2012, 10:55 AM

## 2012-11-19 ENCOUNTER — Encounter (HOSPITAL_COMMUNITY): Payer: Self-pay | Admitting: Neurosurgery

## 2012-11-28 ENCOUNTER — Other Ambulatory Visit: Payer: Self-pay | Admitting: Urology

## 2012-11-28 NOTE — Telephone Encounter (Signed)
After checking with Pam at Dr MacDiarmid's office.  Surgery now rescheduled for 01-29-13 at 0730 at [facility]

## 2012-12-04 ENCOUNTER — Encounter (HOSPITAL_COMMUNITY): Admission: RE | Payer: Self-pay | Source: Ambulatory Visit

## 2012-12-04 ENCOUNTER — Ambulatory Visit (HOSPITAL_COMMUNITY)
Admission: RE | Admit: 2012-12-04 | Payer: BC Managed Care – PPO | Source: Ambulatory Visit | Admitting: Obstetrics and Gynecology

## 2012-12-04 SURGERY — SALPINGO-OOPHORECTOMY, LAPAROSCOPIC
Anesthesia: General | Laterality: Right

## 2012-12-07 ENCOUNTER — Telehealth: Payer: Self-pay | Admitting: Orthopedic Surgery

## 2012-12-07 NOTE — Telephone Encounter (Signed)
Spoke with pt regarding pre op instructions. Questions answered. Reviewed upcoming appts. Mailed instruction sheet to pt.

## 2012-12-10 ENCOUNTER — Ambulatory Visit: Payer: Self-pay | Admitting: Obstetrics and Gynecology

## 2013-01-01 ENCOUNTER — Telehealth: Payer: Self-pay | Admitting: *Deleted

## 2013-01-01 DIAGNOSIS — R9431 Abnormal electrocardiogram [ECG] [EKG]: Secondary | ICD-10-CM

## 2013-01-01 NOTE — Telephone Encounter (Signed)
Pt states that she need as referral to see a cardiologist before December surgery. Wants to see if Dr DDS can order the test she needs. Will start the referral process

## 2013-01-02 NOTE — Telephone Encounter (Signed)
I do not know what test pt is referring to

## 2013-01-03 ENCOUNTER — Other Ambulatory Visit: Payer: Self-pay

## 2013-01-03 ENCOUNTER — Telehealth: Payer: Self-pay | Admitting: *Deleted

## 2013-01-03 NOTE — Telephone Encounter (Signed)
Left VM message regarding appt with cardilogy

## 2013-01-07 ENCOUNTER — Institutional Professional Consult (permissible substitution): Payer: BC Managed Care – PPO | Admitting: Obstetrics and Gynecology

## 2013-01-07 ENCOUNTER — Telehealth: Payer: Self-pay | Admitting: Obstetrics and Gynecology

## 2013-01-07 NOTE — Telephone Encounter (Signed)
Routing to Sally. 

## 2013-01-07 NOTE — Telephone Encounter (Signed)
Doctor canceled appointment for today due to illness.    Return call to patient, surgery consult rescheduled for 01-10-13 at 1200.  Routing to provider for final review. Patient agreeable to disposition. Will close encounter

## 2013-01-07 NOTE — Telephone Encounter (Signed)
Patient says she is returning a call from [place] last night. (no telephone call note)

## 2013-01-08 ENCOUNTER — Ambulatory Visit (INDEPENDENT_AMBULATORY_CARE_PROVIDER_SITE_OTHER): Payer: BC Managed Care – PPO | Admitting: Cardiovascular Disease

## 2013-01-08 ENCOUNTER — Encounter: Payer: Self-pay | Admitting: Cardiovascular Disease

## 2013-01-08 VITALS — BP 110/74 | HR 99 | Ht 65.0 in | Wt 159.0 lb

## 2013-01-08 DIAGNOSIS — I498 Other specified cardiac arrhythmias: Secondary | ICD-10-CM

## 2013-01-08 DIAGNOSIS — R000 Tachycardia, unspecified: Secondary | ICD-10-CM

## 2013-01-08 DIAGNOSIS — R002 Palpitations: Secondary | ICD-10-CM

## 2013-01-08 NOTE — Patient Instructions (Signed)
Your physician recommends that you schedule a follow-up appointment in:  About 4 weeks with Dr. Clifton James  Your physician has requested that you have an echocardiogram. Echocardiography is a painless test that uses sound waves to create images of your heart. It provides your doctor with information about the size and shape of your heart and how well your heart's chambers and valves are working. This procedure takes approximately one hour. There are no restrictions for this procedure.  Your physician has recommended that you wear a holter monitor. Holter monitors are medical devices that record the heart's electrical activity. Doctors most often use these monitors to diagnose arrhythmias. Arrhythmias are problems with the speed or rhythm of the heartbeat. The monitor is a small, portable device. You can wear one while you do your normal daily activities. This is usually used to diagnose what is causing palpitations/syncope (passing out).

## 2013-01-08 NOTE — Progress Notes (Signed)
History of Present Illness: 47 yo female with history of anxiety, arthritis here today as a new patient for evaluation of abnormal EKG. Her EKG on 11/14/12 showed sinus rhythm with incomplete RBBB, poor R wave progression precordial leads. She has had a recent neck surgery. She was told that her EKG was abnormal. She describes no chest pain or SOB but she has been noted to be tachycardic. HR was 150s per pt in her primary care office. She is on Adderall but heart rate is better since starting this. Normal thyroid testing per pt. Used to be on a beta blocker for resting tachycardia. No dyspnea or LE swelling to suggest DVT or PE. She is a very anxious person. She feels her heart racing at night.   Primary Care Physician: Schoenhoff  Last Lipid Profile:Lipid Panel     Component Value Date/Time   CHOL 174 07/03/2012 1123   TRIG 164* 07/03/2012 1123   HDL 70 07/03/2012 1123   CHOLHDL 2.5 07/03/2012 1123   VLDL 33 07/03/2012 1123   LDLCALC 71 07/03/2012 1123     Past Medical History  Diagnosis Date  . Attention deficit disorder   . Anxiety   . Dyspareunia   . Urinary incontinence   . PONV (postoperative nausea and vomiting)   . Seasonal allergies   . Headache(784.0)     due to neck  . Arthritis     Past Surgical History  Procedure Laterality Date  . Incontinence surgery  03/2002    [facility] sling--Dr. Di Kindle in  [facility]  . Abdominal hysterectomy  2001    [facility]-Dr. Miguel Aschoff  . Breast surgery    . Augmentation mammaplasty  2002  . Cervical fusion    . Anterior cervical decompression/discectomy fusion 4 level/hardware removal N/A 11/16/2012    Procedure: Cervical Three-Four, Cervical Four-Five, Cervical Six-Seven, Cervical Seven-Thoracic One Anterior cervical decompression/diskectomy/fusion/possible removal of plate at W0-9;  Surgeon: Karn Cassis, MD;  Location: [facility];  Service: Neurosurgery;  Laterality: N/A;  ANTERIOR CERVICAL DECOMPRESSION/DISCECTOMY FUSION 4  LEVEL/HARDWARE REMOVAL    Current Outpatient Prescriptions  Medication Sig Dispense Refill  . amphetamine-dextroamphetamine (ADDERALL) 20 MG tablet Take 20 mg by mouth 3 (three) times daily.      . cetirizine (ZYRTEC) 10 MG tablet Take 10 mg by mouth as needed.       . diazepam (VALIUM) 5 MG tablet Take 5 mg by mouth at bedtime.      Marland Kitchen venlafaxine (EFFEXOR) 75 MG tablet Take 75 mg by mouth daily.       No current facility-administered medications for this visit.    Allergies  Allergen Reactions  . Adhesive [Tape] Rash  . Latex Rash    History   Social History  . Marital Status: Married    Spouse Name: N/A    Number of Children: N/A  . Years of Education: N/A   Occupational History  . Not on file.   Social History Main Topics  . Smoking status: Never Smoker   . Smokeless tobacco: Never Used  . Alcohol Use: Yes     Comment: socially  . Drug Use: No  . Sexual Activity: Yes    Birth Control/ Protection: Surgical     Comment: [facility] 2001   Other Topics Concern  . Not on file   Social History Narrative  . No narrative on file    Family History  Problem Relation Age of Onset  . Depression Mother   .  Asthma Mother   . Diabetes Father   . Hypertension Father   . Heart disease Father   . Kidney disease Father   . Breast cancer Maternal Grandmother   . Hypertension Maternal Grandmother   . Hypertension Maternal Grandfather   . Hypertension Paternal Grandfather   . Stroke Paternal Grandfather   . Heart failure Father   . CAD Paternal Uncle     Review of Systems:  As stated in the HPI and otherwise negative.   BP 110/74  Pulse 99  Ht 5\' 5"  (1.651 m)  Wt 159 lb (72.122 kg)  BMI 26.46 kg/m2  Physical Examination: General: Well developed, well nourished, NAD HEENT: OP clear, mucus membranes moist SKIN: warm, dry. No rashes. Neuro: No focal deficits Musculoskeletal: Muscle strength 5/5 all ext Psychiatric: Mood and affect normal Neck: No JVD, no carotid  bruits, no thyromegaly, no lymphadenopathy. Lungs:Clear bilaterally, no wheezes, rhonci, crackles Cardiovascular: Regular rate and rhythm. No murmurs, gallops or rubs. Abdomen:Soft. Bowel sounds present. Non-tender.  Extremities: No lower extremity edema. Pulses are 2 + in the bilateral DP/PT.  EKG: Sinus, rate 99 bpm. Incomplete RBBB. Poor R wave progression precordial leads.   Assessment and Plan:   1. Sinus tachycardia: She has resting tachycardia even before she was started on Adderall. Recent normal thyroid studies. No recent illnesses. Will arrange 48 hour monitor to assess tachycardia rates, exclude arrythmias. Will arrange echo to assess LV size and function, exclude pericardial effusion. Will see her back in 3-4 weeks. Will consider starting Toprol at f/u.   2. Incomplete RBBB  3. Palpitations: See above. Will arrange echo and 48 hour Holter monitor  4. Abnormal EKG: Poor R wave progression through precordial leads. She has no cardiac risk factors and no chest pain. I do not think she has had a prior infarct. Echo to assess LV wall motion.

## 2013-01-09 ENCOUNTER — Encounter: Payer: Self-pay | Admitting: *Deleted

## 2013-01-09 ENCOUNTER — Encounter (INDEPENDENT_AMBULATORY_CARE_PROVIDER_SITE_OTHER): Payer: BC Managed Care – PPO

## 2013-01-09 DIAGNOSIS — R002 Palpitations: Secondary | ICD-10-CM

## 2013-01-09 DIAGNOSIS — R000 Tachycardia, unspecified: Secondary | ICD-10-CM

## 2013-01-09 DIAGNOSIS — I498 Other specified cardiac arrhythmias: Secondary | ICD-10-CM

## 2013-01-09 NOTE — Progress Notes (Signed)
Patient ID: Kaitlyn Carter, female   DOB: June 25, 1965, 47 y.o.   MRN: [id number] E-Cardio 48 hour holter monitor applied to patient.

## 2013-01-10 ENCOUNTER — Ambulatory Visit (INDEPENDENT_AMBULATORY_CARE_PROVIDER_SITE_OTHER): Payer: BC Managed Care – PPO | Admitting: Obstetrics and Gynecology

## 2013-01-10 ENCOUNTER — Ambulatory Visit (INDEPENDENT_AMBULATORY_CARE_PROVIDER_SITE_OTHER): Payer: BC Managed Care – PPO

## 2013-01-10 ENCOUNTER — Other Ambulatory Visit: Payer: Self-pay | Admitting: *Deleted

## 2013-01-10 ENCOUNTER — Encounter: Payer: Self-pay | Admitting: Obstetrics and Gynecology

## 2013-01-10 VITALS — BP 120/64 | HR 70 | Ht 65.0 in | Wt 161.5 lb

## 2013-01-10 DIAGNOSIS — N83209 Unspecified ovarian cyst, unspecified side: Secondary | ICD-10-CM

## 2013-01-10 DIAGNOSIS — N83202 Unspecified ovarian cyst, left side: Secondary | ICD-10-CM

## 2013-01-10 DIAGNOSIS — K625 Hemorrhage of anus and rectum: Secondary | ICD-10-CM

## 2013-01-10 NOTE — Progress Notes (Signed)
Patient ID: Kaitlyn Carter, female   DOB: 05/25/1965, 47 y.o.   MRN: [id number]  GYNECOLOGY PROBLEM VISIT  PCP:   Referring provider:   HPI: 47 y.o.   Married  Caucasian  female   G3P0 with No LMP recorded. Patient has had a hysterectomy.   here for surgical discussion.  Patient is scheduled to proceed with a laparoscopic left salpingo-oophorectomy and right salpingectomy at the time of her upcoming excision of prior Spark sling and replacement with a new sling. Has a history of two left ovarian cysts. Status that the pain in the LLQ is now resolved.    In the interim since patient's last office visit has had multiple new health issues. Is status post cervical spine fusion done by Dr. Jeral Fruit.   Uses valium to help with sleep.    Having Holter monitoring for cardiac arrhythmia.  Sinus tachycardia and incomplete RBBB.  Had significant rectal bleeding following intercourse.  No rectal penetration.   No NSAIDS use.  History of hemorrhoids.   No polyps.   Has constipation.    GYNECOLOGIC HISTORY: No LMP recorded. Patient has had a hysterectomy.    OB History   Grav Para Term Preterm Abortions TAB SAB Ect Mult Living   3         3         Family History  Problem Relation Age of Onset  . Depression Mother   . Asthma Mother   . Diabetes Father   . Hypertension Father   . Heart disease Father   . Kidney disease Father   . Breast cancer Maternal Grandmother   . Hypertension Maternal Grandmother   . Hypertension Maternal Grandfather   . Hypertension Paternal Grandfather   . Stroke Paternal Grandfather   . Heart failure Father   . CAD Paternal Uncle     Patient Active Problem List   Diagnosis Date Noted  . ADD (attention deficit disorder) 08/29/2012  . Snoring 07/19/2012  . Hypercalcemia 07/19/2012  . 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 Adjustment disorder with mixed anxiety and depressed mood 07/03/2012  . Urinary incontinence 07/03/2012  . Asthmatic bronchitis  07/03/2012  . Allergic rhinitis 07/03/2012  . S/P hysterectomy 07/03/2012    Past Medical History  Diagnosis Date  . Attention deficit disorder   . Anxiety   . Dyspareunia   . Urinary incontinence   . PONV (postoperative nausea and vomiting)   . Seasonal allergies   . Headache(784.0)     due to neck  . Arthritis     Past Surgical History  Procedure Laterality Date  . Incontinence surgery  03/2002    [facility] sling--Dr. Di Kindle in  [facility]  . Abdominal hysterectomy  2001    [facility]-Dr. Miguel Aschoff  . Breast surgery    . Augmentation mammaplasty  2002  . Cervical fusion    . Anterior cervical decompression/discectomy fusion 4 level/hardware removal N/A 11/16/2012    Procedure: Cervical Three-Four, Cervical Four-Five, Cervical Six-Seven, Cervical Seven-Thoracic One Anterior cervical decompression/diskectomy/fusion/possible removal of plate at W0-9;  Surgeon: Karn Cassis, MD;  Location: [facility];  Service: Neurosurgery;  Laterality: N/A;  ANTERIOR CERVICAL DECOMPRESSION/DISCECTOMY FUSION 4 LEVEL/HARDWARE REMOVAL    ALLERGIES: Adhesive and Latex  Current Outpatient Prescriptions  Medication Sig Dispense Refill  . amphetamine-dextroamphetamine (ADDERALL) 20 MG tablet Take 20 mg by mouth 3 (three) times daily.      . cetirizine (ZYRTEC) 10 MG tablet Take 10 mg by mouth as needed.       Marland Kitchen  diazepam (VALIUM) 5 MG tablet Take 5 mg by mouth at bedtime.      Marland Kitchen venlafaxine (EFFEXOR) 75 MG tablet Take 75 mg by mouth daily.       No current facility-administered medications for this visit.     ROS:  Pertinent items are noted in HPI.  SOCIAL HISTORY:  Married.   PHYSICAL EXAMINATION:    BP 120/64  Pulse 70  Ht 5\' 5"  (1.651 m)  Wt 161 lb 8 oz (73.256 kg)  BMI 26.88 kg/m2   Wt Readings from Last 3 Encounters:  01/10/13 161 lb 8 oz (73.256 kg)  01/08/13 159 lb (72.122 kg)  11/14/12 161 lb (73.029 kg)     Ht Readings from Last 3 Encounters:  01/10/13 5\' 5"  (1.651 m)   01/08/13 5\' 5"  (1.651 m)  11/14/12 5\' 5"  (1.651 m)    General appearance: alert, cooperative and appears stated age Head: Normocephalic, without obvious abnormality, atraumatic Neck:  Left anterior neck incision.   Lungs: clear to auscultation bilaterally Heart: regular rate and rhythm.  Holter monitor on chest.  Abdomen: soft, non-tender; no masses,  no organomegaly Extremities: extremities normal, atraumatic, no cyanosis or edema No abnormal inguinal nodes palpated Neurologic: Grossly normal  Pelvic: External genitalia:  no lesions              Urethra:  normal appearing urethra with no masses, tenderness or lesions              Bartholins and Skenes: normal                 Vagina: normal appearing vagina with normal color and discharge, no lesions              Cervix:  absent              Pap and high risk HPV testing done: no.            Bimanual Exam:  Uterus:  absent                                      Adnexa: normal adnexa in size, nontender and no masses                                      Rectovaginal: Confirms                                      Anus:  normal sphincter tone, no lesions  Procedure - Anoscopy Verbal consent for procedure. Anoscope placed without difficulty using surgilube. No lesions or blood noted.    ASSESSMENT  LLQ pain resolved.  Episode of rectal bleeding.  Anoscopy unremarkable. Female and female dyspareunia. Genuine stress incontinence.   PLAN  Proceed with pelvic ultrasound now.  IFOB for the patient to complete. Referral to Dr. Loreta Ave. Proceed with surgery with Dr. Sherron Monday for removal of old sling and replacement with new sling.   An After Visit Summary was printed and given to the patient.  25 minutes face to face time of which over 50% was spent in counseling.

## 2013-01-10 NOTE — Progress Notes (Signed)
Subjective  Ultrasound to recheck left ovarian cyst previously noted on ultrasound.  Objective  See below - bilateral ovarian follicles, left ovarian cyst resolved, normal vaginal cuff.     Assessment  Left ovarian cyst resolved. LLQ pain resolved.  Plan  Will cancel laparoscopy at the time of midurethral sling removal and replacement.  Patient is in agreement with this plan.

## 2013-01-14 ENCOUNTER — Ambulatory Visit: Payer: Self-pay | Admitting: Obstetrics and Gynecology

## 2013-01-14 ENCOUNTER — Telehealth: Payer: Self-pay | Admitting: Obstetrics and Gynecology

## 2013-01-14 NOTE — Telephone Encounter (Signed)
Per Pam at Dr Sherron Monday, they will move up to 0730 start time and Dr Edward Jolly will assit.  They are aware we are no longer doing the laparoscopy part of the case. Call to hospital, [facility].

## 2013-01-14 NOTE — Telephone Encounter (Signed)
Pam from Dr McDermits office is calling sally wants you to return her call

## 2013-01-14 NOTE — Telephone Encounter (Signed)
Hospital notified of change in case, no longer doing laparoscopy.  MacDiarmid to begin at 0730.

## 2013-01-15 ENCOUNTER — Encounter (HOSPITAL_COMMUNITY): Payer: Self-pay | Admitting: Pharmacist

## 2013-01-18 ENCOUNTER — Telehealth: Payer: Self-pay | Admitting: *Deleted

## 2013-01-18 NOTE — Telephone Encounter (Signed)
I placed call to pt to review monitor results. Left message to call back 

## 2013-01-18 NOTE — Telephone Encounter (Signed)
Spoke with pt and reviewed monitor results with her.  

## 2013-01-21 ENCOUNTER — Encounter (HOSPITAL_COMMUNITY): Payer: Self-pay

## 2013-01-21 LAB — CBC
MCH: 31 pg (ref 26.0–34.0)
MCHC: 34.7 g/dL (ref 30.0–36.0)
MCV: 89.3 fL (ref 78.0–100.0)
Platelets: 278 10*3/uL (ref 150–400)
RBC: 4.29 MIL/uL (ref 3.87–5.11)

## 2013-01-21 NOTE — Pre-Procedure Instructions (Signed)
Reviewed patient's medical history, medications, recent cervical fusion post-op 8 wks ago with Dr Rodman Pickle.  Patient is seening Dr McAlhaney-Cardiologist  [phone] for irregular heart beat.  Dr Rodman Pickle reviewed EKG, no orders given.  Dr Rodman Pickle requested Dr Weldon Picking notes from last office visit.  Patient had 48 - hour holter monitoring done 01/09/13.  Patient has appt. for tomorrow for echo.  Will request from Dr. Hilda Lias - Neurosurgeon if patient ok to be intubated for surgery on 01/29/13 [phone].  Faxed request to [phone].

## 2013-01-21 NOTE — Patient Instructions (Addendum)
   Your procedure is scheduled on: Tuesday, December 2  Enter through the [employer] of [facility] at: 6 AM Pick up the phone at the desk and dial [phone] and inform us of your arrival.  Please call this number if you have any problems the morning of surgery: [phone]  Remember: Do not eat or drink after midnight: Monday Take these medicines the morning of surgery with a SIP OF WATER: None  Do not wear jewelry, make-up, or FINGER nail polish No metal in your hair or on your body. Do not wear lotions, powders, perfumes. You may wear deodorant.  Please use your CHG wash as directed prior to surgery.  Do not shave anywhere for at least 12 hours prior to first CHG shower.  Do not bring valuables to the hospital. Contacts, dentures or bridgework may not be worn into surgery.  Leave suitcase in the car. After Surgery it may be brought to your room. For patients being admitted to the hospital, checkout time is 11:00am the day of discharge.  Patients discharged on the day of surgery will not be allowed to drive home.  Home with Husband Tawanna Cooler cell [phone]

## 2013-01-22 ENCOUNTER — Encounter: Payer: Self-pay | Admitting: Cardiology

## 2013-01-22 ENCOUNTER — Encounter (HOSPITAL_COMMUNITY)
Admission: RE | Admit: 2013-01-22 | Discharge: 2013-01-22 | Disposition: A | Discharge status: Home / Self Care | Payer: BC Managed Care – PPO | Source: Ambulatory Visit | Attending: Obstetrics and Gynecology | Admitting: Obstetrics and Gynecology

## 2013-01-22 ENCOUNTER — Ambulatory Visit (HOSPITAL_BASED_OUTPATIENT_CLINIC_OR_DEPARTMENT_OTHER): Payer: BC Managed Care – PPO | Admitting: Radiology

## 2013-01-22 DIAGNOSIS — Z01818 Encounter for other preprocedural examination: Secondary | ICD-10-CM | POA: Insufficient documentation

## 2013-01-22 DIAGNOSIS — R002 Palpitations: Secondary | ICD-10-CM

## 2013-01-22 DIAGNOSIS — Z01812 Encounter for preprocedural laboratory examination: Secondary | ICD-10-CM | POA: Insufficient documentation

## 2013-01-22 DIAGNOSIS — I452 Bifascicular block: Secondary | ICD-10-CM

## 2013-01-22 DIAGNOSIS — Z01810 Encounter for preprocedural cardiovascular examination: Secondary | ICD-10-CM | POA: Insufficient documentation

## 2013-01-22 DIAGNOSIS — R9430 Abnormal result of cardiovascular function study, unspecified: Secondary | ICD-10-CM

## 2013-01-22 DIAGNOSIS — R000 Tachycardia, unspecified: Secondary | ICD-10-CM

## 2013-01-22 HISTORY — DX: Cardiac arrhythmia, unspecified: I49.9

## 2013-01-22 NOTE — Progress Notes (Signed)
Echocardiogram performed.  

## 2013-01-28 ENCOUNTER — Other Ambulatory Visit: Payer: Self-pay | Admitting: Urology

## 2013-01-28 NOTE — H&P (Signed)
History of Present Illness   Kaitlyn Carter was here to discuss her surgery. She knows she has a benign cyst. I drew her another picture. Goals were discussed. Our first goal is to remove the suburethral component, cut the synthetic sling. This should take care of her husbands scratching of the genitalia with intercourse. She understands that if there ever any injury or complicating factor, we would stop at that point.   Our next goal is to help her stress urinary incontinence. She has insurance sensitivities. I drew her a picture and went over the conventional dermal sling procedure. I would use a 7 x 4 dermal graft and Prolene suture. Her lower abdominal incision would be lengthened as noted. I would break through the endopelvic fascia. Pros, cons, and risks were discussed.   We talked about a sling in detail. Pros, cons, general surgical and anesthetic risks, and other options including behavioral therapy and watchful waiting were discussed. She understands that slings are generally successful in 90% of cases for stress incontinence, 50% for urge incontinence, and that in a small percentage of cases the incontinence can worsen. The risk of persistent, de novo, or worsening incontinence/dysfunction was discussed. Risks were described but not limited to the discussion of injury to neighboring structures including the bowel (with possible life-threatening sepsis and colostomy), bladder, urethra, vagina (all resulting in further surgery), and ureter (resulting in re-implantation). We also talked about the risk of retention requiring urethrolysis, extrusion requiring revision, and erosion resulting in further surgery. Bleeding risks and transfusion rates and the risk of infection were discussed. The risk of pelvic and abdominal pain syndromes, dyspareunia, and neuropathies were discussed. The need for CIC was described as well as the usual postoperative course. The patient understands that she might not reach her  treatment goal and that she might be worse following surgery. Mesh TV issues were discussed.  She would be taught how to catheterize a week prior and given supplies.   Doing a staged procedure and even using a synthetic sling was also discussed, but I think the above is a good treatment choice for her. She understands that the chance of pelvic pain improving is at best 50%.   She is going to have her ovary removed. We will proceed accordingly. We will schedule surgery with Dr. Edward Jolly. She is having neck surgery, I believe, before the end of the year.    Past Medical History Problems  1. History of  Anxiety (Symptom) 300.00 2. History of  Depression 311 3. History of  Esophageal Reflux 530.81  Surgical History Problems  1. History of  Breast Surgery 2. History of  Cervical Surgery (Gyn) 3. History of  Hysterectomy V45.77  Current Meds 1. Adderall 10 MG Oral Tablet; Therapy: (Recorded:11Aug2014) to 2. Estrace CREA; Therapy: (Recorded:11Aug2014) to 3. Lexapro TABS; Therapy: (Recorded:28Aug2014) to  Allergies Medication  1. No Known Drug Allergies  Family History Problems  1. Paternal history of  Blood In Urine 2. Family history of  Death In The Family Father age 55 due to kidney failure 3. Family history of  Family Health Status Number Of Children one son and two daughters 4. Paternal history of  Renal Failure  Social History Problems  1. Being A Social Drinker 2. Caffeine Use two per day 3. Marital History - Currently Married 4. Never A Smoker 5. Occupation: stay at home mom  Vitals Vital Signs [Data Includes: Last 1 Day]  28Aug2014 02:08PM  Blood Pressure: 123 / 88 Temperature: 98.1 F Heart  Rate: 83  Results/Data  25 Oct 2012 1:59 PM   UA With REFLEX       COLOR YELLOW       APPEARANCE CLEAR       SPECIFIC GRAVITY 1.010       pH 6.0       GLUCOSE NEG       BILIRUBIN NEG       KETONE NEG       BLOOD TRACE       PROTEIN NEG       UROBILINOGEN 0.2        NITRITE NEG       LEUKOCYTE ESTERASE NEG       SQUAMOUS EPITHELIAL/HPF FEW       WBC NONE SEEN       CRYSTALS NONE SEEN       CASTS NONE SEEN       RBC 0-2       BACTERIA NONE SEEN      Assessment Assessed  1. Renal Cyst 593.2 2. Urge And Stress Incontinence 788.33  Plan   Discussion/Summary Because of the dyspareunia and especially because of scratching of the female genitalia, we are not going to use synthetic sling at this stage.    The role of urethral injectables, especially in young woman, was also discussed.    Patient will be scheduled for surgery. I am going to send a copy of my note to Dr. Conley Simmonds to keep him updated on her treatment course.  After a thorough review of the management options for the patient's condition the patient  elected to proceed with surgical therapy as noted above. We have discussed the potential benefits and risks of the procedure, side effects of the proposed treatment, the likelihood of the patient achieving the goals of the procedure, and any potential problems that might occur during the procedure or recuperation. Informed consent has been obtained.

## 2013-01-29 ENCOUNTER — Ambulatory Visit (HOSPITAL_COMMUNITY)
Admission: RE | Admit: 2013-01-29 | Discharge: 2013-01-29 | Disposition: A | Discharge status: Home / Self Care | Payer: BC Managed Care – PPO | Source: Ambulatory Visit | Attending: Obstetrics and Gynecology | Admitting: Obstetrics and Gynecology

## 2013-01-29 ENCOUNTER — Ambulatory Visit (HOSPITAL_COMMUNITY): Payer: BC Managed Care – PPO | Admitting: Anesthesiology

## 2013-01-29 ENCOUNTER — Encounter (HOSPITAL_COMMUNITY)
Admission: RE | Disposition: A | Discharge status: Home / Self Care | Payer: Self-pay | Source: Ambulatory Visit | Attending: Obstetrics and Gynecology

## 2013-01-29 ENCOUNTER — Encounter (HOSPITAL_COMMUNITY): Payer: BC Managed Care – PPO | Admitting: Anesthesiology

## 2013-01-29 DIAGNOSIS — Q619 Cystic kidney disease, unspecified: Secondary | ICD-10-CM | POA: Insufficient documentation

## 2013-01-29 DIAGNOSIS — N393 Stress incontinence (female) (male): Secondary | ICD-10-CM

## 2013-01-29 DIAGNOSIS — IMO0002 Reserved for concepts with insufficient information to code with codable children: Secondary | ICD-10-CM | POA: Insufficient documentation

## 2013-01-29 DIAGNOSIS — N3946 Mixed incontinence: Secondary | ICD-10-CM | POA: Insufficient documentation

## 2013-01-29 DIAGNOSIS — T8389XA Other specified complication of genitourinary prosthetic devices, implants and grafts, initial encounter: Secondary | ICD-10-CM

## 2013-01-29 DIAGNOSIS — T85698A Other mechanical complication of other specified internal prosthetic devices, implants and grafts, initial encounter: Secondary | ICD-10-CM | POA: Insufficient documentation

## 2013-01-29 HISTORY — PX: PUBOVAGINAL SLING: SHX1035

## 2013-01-29 HISTORY — PX: CYSTOSCOPY: SHX5120

## 2013-01-29 SURGERY — CREATION, PUBOVAGINAL SLING
Anesthesia: General | Site: Vagina

## 2013-01-29 MED ORDER — FENTANYL CITRATE 0.05 MG/ML IJ SOLN
INTRAMUSCULAR | Status: AC
  Filled 2013-01-29: qty 5

## 2013-01-29 MED ORDER — HYDROMORPHONE HCL PF 1 MG/ML IJ SOLN
0.2500 mg | INTRAMUSCULAR | Status: DC | PRN
  Administered 2013-01-29 (×2): 0.5 mg via INTRAVENOUS

## 2013-01-29 MED ORDER — CEFAZOLIN SODIUM-DEXTROSE 2-3 GM-% IV SOLR
2.0000 g | Freq: Once | INTRAVENOUS | Status: AC
  Administered 2013-01-29: 2 g via INTRAVENOUS

## 2013-01-29 MED ORDER — PHENYLEPHRINE 40 MCG/ML (10ML) SYRINGE FOR IV PUSH (FOR BLOOD PRESSURE SUPPORT)
PREFILLED_SYRINGE | INTRAVENOUS | Status: AC
  Filled 2013-01-29: qty 5

## 2013-01-29 MED ORDER — DIPHENHYDRAMINE HCL 50 MG/ML IJ SOLN
INTRAMUSCULAR | Status: DC | PRN
  Administered 2013-01-29: 12.5 mg via INTRAVENOUS

## 2013-01-29 MED ORDER — LIDOCAINE-EPINEPHRINE (PF) 1 %-1:200000 IJ SOLN
INTRAMUSCULAR | Status: AC
  Filled 2013-01-29: qty 10

## 2013-01-29 MED ORDER — DEXAMETHASONE SODIUM PHOSPHATE 10 MG/ML IJ SOLN
INTRAMUSCULAR | Status: DC | PRN
  Administered 2013-01-29: 10 mg via INTRAVENOUS

## 2013-01-29 MED ORDER — PROPOFOL 10 MG/ML IV EMUL
INTRAVENOUS | Status: AC
  Filled 2013-01-29: qty 20

## 2013-01-29 MED ORDER — SODIUM CHLORIDE 0.9 % IR SOLN
Status: DC | PRN
  Administered 2013-01-29: 08:00:00

## 2013-01-29 MED ORDER — LIDOCAINE HCL (CARDIAC) 20 MG/ML IV SOLN
INTRAVENOUS | Status: DC | PRN
  Administered 2013-01-29: 50 mg via INTRAVENOUS

## 2013-01-29 MED ORDER — MIDAZOLAM HCL 2 MG/2ML IJ SOLN
INTRAMUSCULAR | Status: DC | PRN
  Administered 2013-01-29: 2 mg via INTRAVENOUS

## 2013-01-29 MED ORDER — GLYCOPYRROLATE 0.2 MG/ML IJ SOLN
INTRAMUSCULAR | Status: DC | PRN
  Administered 2013-01-29: 0.6 mg via INTRAVENOUS

## 2013-01-29 MED ORDER — CEFAZOLIN SODIUM-DEXTROSE 2-3 GM-% IV SOLR
INTRAVENOUS | Status: AC
  Filled 2013-01-29: qty 50

## 2013-01-29 MED ORDER — DEXAMETHASONE SODIUM PHOSPHATE 10 MG/ML IJ SOLN
INTRAMUSCULAR | Status: AC
  Filled 2013-01-29: qty 1

## 2013-01-29 MED ORDER — ROCURONIUM BROMIDE 100 MG/10ML IV SOLN
INTRAVENOUS | Status: AC
  Filled 2013-01-29: qty 1

## 2013-01-29 MED ORDER — SODIUM CHLORIDE 0.9 % IR SOLN
Freq: Once | Status: DC
  Filled 2013-01-29: qty 1

## 2013-01-29 MED ORDER — FENTANYL CITRATE 0.05 MG/ML IJ SOLN
INTRAMUSCULAR | Status: DC | PRN
  Administered 2013-01-29 (×2): 100 ug via INTRAVENOUS
  Administered 2013-01-29: 50 ug via INTRAVENOUS

## 2013-01-29 MED ORDER — HYDROCODONE-ACETAMINOPHEN 5-325 MG PO TABS: 1.0000 | ORAL_TABLET | Freq: Four times a day (QID) | ORAL | Status: DC | PRN

## 2013-01-29 MED ORDER — PROPOFOL 10 MG/ML IV BOLUS
INTRAVENOUS | Status: DC | PRN
  Administered 2013-01-29: 180 mg via INTRAVENOUS

## 2013-01-29 MED ORDER — ESTRADIOL 0.1 MG/GM VA CREA
TOPICAL_CREAM | VAGINAL | Status: AC
  Filled 2013-01-29: qty 42.5

## 2013-01-29 MED ORDER — ONDANSETRON HCL 4 MG/2ML IJ SOLN
INTRAMUSCULAR | Status: DC | PRN
  Administered 2013-01-29: 4 mg via INTRAVENOUS

## 2013-01-29 MED ORDER — METOCLOPRAMIDE HCL 5 MG/ML IJ SOLN
INTRAMUSCULAR | Status: AC
  Filled 2013-01-29: qty 4

## 2013-01-29 MED ORDER — LIDOCAINE HCL (CARDIAC) 20 MG/ML IV SOLN
INTRAVENOUS | Status: AC
  Filled 2013-01-29: qty 5

## 2013-01-29 MED ORDER — HYDROMORPHONE HCL PF 1 MG/ML IJ SOLN
INTRAMUSCULAR | Status: AC
  Administered 2013-01-29: 0.5 mg via INTRAVENOUS
  Filled 2013-01-29: qty 1

## 2013-01-29 MED ORDER — GENTAMICIN IN SALINE 1.6-0.9 MG/ML-% IV SOLN
80.0000 mg | INTRAVENOUS | Status: AC
  Administered 2013-01-29: 80 mg via INTRAVENOUS
  Filled 2013-01-29: qty 50

## 2013-01-29 MED ORDER — CIPROFLOXACIN HCL 250 MG PO TABS: 250.0000 mg | ORAL_TABLET | Freq: Two times a day (BID) | ORAL | Status: DC

## 2013-01-29 MED ORDER — NEOSTIGMINE METHYLSULFATE 1 MG/ML IJ SOLN
INTRAMUSCULAR | Status: DC | PRN
  Administered 2013-01-29: 3 mg via INTRAVENOUS

## 2013-01-29 MED ORDER — MIDAZOLAM HCL 2 MG/2ML IJ SOLN
INTRAMUSCULAR | Status: AC
  Filled 2013-01-29: qty 2

## 2013-01-29 MED ORDER — ESTRADIOL 0.1 MG/GM VA CREA
TOPICAL_CREAM | VAGINAL | Status: DC | PRN
  Administered 2013-01-29: 1 via VAGINAL

## 2013-01-29 MED ORDER — LACTATED RINGERS IV SOLN
INTRAVENOUS | Status: DC
  Administered 2013-01-29 (×4): via INTRAVENOUS

## 2013-01-29 MED ORDER — DIPHENHYDRAMINE HCL 50 MG/ML IJ SOLN
INTRAMUSCULAR | Status: AC
  Filled 2013-01-29: qty 1

## 2013-01-29 MED ORDER — STERILE WATER FOR IRRIGATION IR SOLN
Status: DC | PRN
  Administered 2013-01-29: 1000 mL

## 2013-01-29 MED ORDER — METOCLOPRAMIDE HCL 5 MG/ML IJ SOLN
INTRAMUSCULAR | Status: DC | PRN
  Administered 2013-01-29: 10 mg via INTRAVENOUS

## 2013-01-29 MED ORDER — ROCURONIUM BROMIDE 100 MG/10ML IV SOLN
INTRAVENOUS | Status: DC | PRN
  Administered 2013-01-29: 10 mg via INTRAVENOUS
  Administered 2013-01-29: 40 mg via INTRAVENOUS
  Administered 2013-01-29: 10 mg via INTRAVENOUS

## 2013-01-29 MED ORDER — PHENYLEPHRINE HCL 10 MG/ML IJ SOLN
INTRAMUSCULAR | Status: DC | PRN
  Administered 2013-01-29: 40 ug via INTRAVENOUS
  Administered 2013-01-29 (×3): 80 ug via INTRAVENOUS
  Administered 2013-01-29: 40 ug via INTRAVENOUS
  Administered 2013-01-29: 80 ug via INTRAVENOUS
  Administered 2013-01-29: 40 ug via INTRAVENOUS
  Administered 2013-01-29: 80 ug via INTRAVENOUS
  Administered 2013-01-29 (×2): 40 ug via INTRAVENOUS
  Administered 2013-01-29: 80 ug via INTRAVENOUS

## 2013-01-29 SURGICAL SUPPLY — 47 items
ADH SKN CLS APL DERMABOND .7 (GAUZE/BANDAGES/DRESSINGS) ×2
BLADE SURG 15 STRL LF C SS BP (BLADE) ×2 IMPLANT
BLADE SURG 15 STRL SS (BLADE) ×3
CANISTER SUCT 3000ML (MISCELLANEOUS) ×3 IMPLANT
CATH FOLEY 2WAY SLVR  5CC 16FR (CATHETERS) ×1
CATH FOLEY 2WAY SLVR 5CC 16FR (CATHETERS) ×2 IMPLANT
CATH ROBINSON RED A/P 16FR (CATHETERS) IMPLANT
COVER MAYO STAND STRL (DRAPES) ×3 IMPLANT
DECANTER SPIKE VIAL GLASS SM (MISCELLANEOUS) ×1 IMPLANT
DERMABOND ADVANCED (GAUZE/BANDAGES/DRESSINGS) ×1
DERMABOND ADVANCED .7 DNX12 (GAUZE/BANDAGES/DRESSINGS) ×2 IMPLANT
DEVICE CAPIO SLIM SINGLE (INSTRUMENTS) IMPLANT
DRAIN PENROSE 1/4X12 LTX (DRAIN) ×3 IMPLANT
DRAPE HYSTEROSCOPY (DRAPE) ×1 IMPLANT
DRAPE UNDERBUTTOCKS STRL (DRAPE) ×3 IMPLANT
GAUZE PACKING 1 X5 YD ST (GAUZE/BANDAGES/DRESSINGS) ×1 IMPLANT
GAUZE PACKING 2X5 YD STERILE (GAUZE/BANDAGES/DRESSINGS) IMPLANT
GLOVE BIO SURGEON STRL SZ7.5 (GLOVE) ×3 IMPLANT
GLOVE BIOGEL PI IND STRL 8 (GLOVE) ×4 IMPLANT
GLOVE BIOGEL PI INDICATOR 8 (GLOVE) ×2
GOWN PREVENTION PLUS LG XLONG (DISPOSABLE) ×12 IMPLANT
NS IRRIG 1000ML POUR BTL (IV SOLUTION) ×3 IMPLANT
PACK VAGINAL WOMENS (CUSTOM PROCEDURE TRAY) ×1 IMPLANT
PENCIL BUTTON HOLSTER BLD 10FT (ELECTRODE) ×3 IMPLANT
PLUG CATH AND CAP STER (CATHETERS) ×3 IMPLANT
RETRACTOR STAY HOOK 5MM (MISCELLANEOUS) ×2 IMPLANT
SET CYSTO W/LG BORE CLAMP LF (SET/KITS/TRAYS/PACK) ×3 IMPLANT
SHEET LAVH (DRAPES) ×3 IMPLANT
SUT CAPIO ETHIBPND (SUTURE) IMPLANT
SUT PROLENE 0 CT 1 30 (SUTURE) ×2 IMPLANT
SUT SILK 2 0 FS (SUTURE) IMPLANT
SUT VIC AB 0 CT1 27 (SUTURE)
SUT VIC AB 0 CT1 27XBRD ANBCTR (SUTURE) IMPLANT
SUT VIC AB 2-0 CT1 27 (SUTURE) ×3
SUT VIC AB 2-0 CT1 TAPERPNT 27 (SUTURE) IMPLANT
SUT VIC AB 2-0 CT2 27 (SUTURE) IMPLANT
SUT VIC AB 2-0 SH 27 (SUTURE)
SUT VIC AB 2-0 SH 27XBRD (SUTURE) IMPLANT
SUT VIC AB 3-0 SH 27 (SUTURE) ×6
SUT VIC AB 3-0 SH 27XBRD (SUTURE) IMPLANT
SUT VIC AB 4-0 PS2 27 (SUTURE) ×3 IMPLANT
TISSUE REPAIR XENFORM 4X7CM (Tissue) ×1 IMPLANT
TOWEL OR 17X24 6PK STRL BLUE (TOWEL DISPOSABLE) ×6 IMPLANT
TRAY FOLEY CATH 14FR (SET/KITS/TRAYS/PACK) ×3 IMPLANT
TUBING CONNECTING 10 (TUBING) ×3 IMPLANT
TUBING NON-CON 1/4 X 20 CONN (TUBING) ×3 IMPLANT
WATER STERILE IRR 1000ML POUR (IV SOLUTION) ×3 IMPLANT

## 2013-01-29 NOTE — Anesthesia Preprocedure Evaluation (Signed)
Anesthesia Evaluation  Patient identified by MRN, date of birth, ID band Patient awake    Reviewed: Allergy & Precautions, H&P , NPO status   History of Anesthesia Complications (+) PONV  Airway Mallampati: I  Neck ROM: Limited    Dental  (+) Teeth Intact   Pulmonary asthma ,  breath sounds clear to auscultation        Cardiovascular negative cardio ROS  Rhythm:Regular Rate:Normal     Neuro/Psych  Headaches,    GI/Hepatic negative GI ROS, Neg liver ROS, Patient received Oral Contrast Agents,  Endo/Other  negative endocrine ROS  Renal/GU negative Renal ROS     Musculoskeletal  (+) Arthritis -,   Abdominal   Peds  Hematology negative hematology ROS (+)   Anesthesia Other Findings Adjustment disorder with mixed anxiety and depressed mood    Urinary incontinence    Asthmatic bronchitis      Snoring       ADD     Reproductive/Obstetrics                           Anesthesia Physical  Anesthesia Plan  ASA: II  Anesthesia Plan: General   Post-op Pain Management:    Induction: Intravenous  Airway Management Planned: Oral ETT and Video Laryngoscope Planned  Additional Equipment:   Intra-op Plan:   Post-operative Plan: Extubation in OR  Informed Consent: I have reviewed the patients History and Physical, chart, labs and discussed the procedure including the risks, benefits and alternatives for the proposed anesthesia with the patient or authorized representative who has indicated his/her understanding and acceptance.   Dental advisory given  Plan Discussed with: Surgeon and CRNA  Anesthesia Plan Comments: (Had neck surgery in Sept............... Easy mask airway, Glidescope used)        Anesthesia Quick Evaluation

## 2013-01-29 NOTE — Interval H&P Note (Signed)
History and Physical Interval Note:  01/29/2013 7:28 AM  Kaitlyn Carter  has presented today for surgery, with the diagnosis of ovarian cyst CPT [phone]  The various methods of treatment have been discussed with the patient and family. After consideration of risks, benefits and other options for treatment, the patient has consented to  Procedure(s): REMOVAL OF SLING AND PUBO-VAGINAL SLING (N/A) CYSTOSCOPY (N/A) as a surgical intervention .  The patient's history has been reviewed, patient examined, no change in status, stable for surgery.  I have reviewed the patient's chart and labs.  Questions were answered to the patient's satisfaction.     Forrest Jaroszewski A

## 2013-01-29 NOTE — Transfer of Care (Signed)
Immediate Anesthesia Transfer of Care Note  Patient: Kaitlyn Carter  Procedure(s) Performed: Procedure(s): REMOVAL OF SLING AND PUBO-VAGINAL SLING (N/A) CYSTOSCOPY (N/A)  Patient Location: PACU  Anesthesia Type:General  Level of Consciousness: awake  Airway & Oxygen Therapy: Patient Spontanous Breathing  Post-op Assessment: Report given to PACU RN  Post vital signs: stable  Filed Vitals:   01/29/13 0614  BP: 110/84  Pulse: 92  Temp: 36.7 C  Resp: 18    Complications: No apparent anesthesia complications

## 2013-01-29 NOTE — Anesthesia Postprocedure Evaluation (Signed)
Anesthesia Post Note  Patient: Kaitlyn Carter  Procedure(s) Performed: Procedure(s) (LRB): REMOVAL OF SLING ; INSERTION OF XEROFORM GRAFT (N/A) CYSTOSCOPY (N/A)  Anesthesia type: GA  Patient location: PACU  Post pain: Pain level controlled  Post assessment: Post-op Vital signs reviewed  Last Vitals:  Filed Vitals:   01/29/13 1045  BP:   Pulse: 74  Temp:   Resp: 11    Post vital signs: Reviewed  Level of consciousness: sedated  Complications: No apparent anesthesia complications

## 2013-01-29 NOTE — Preoperative (Signed)
Beta Blockers   Reason not to administer Beta Blockers:Not Applicable 

## 2013-01-29 NOTE — Op Note (Signed)
Preoperative diagnosis: Sling extrusion with dyspareunia and stress urinary incontinence Postoperative diagnosis: Probable sling extrusion with dyspareunia and stress urinary incontinence Surgery: Removal of suburethral segment of synthetic sling, insertion of pubovaginal sling, and cystoscopy Surgeon: Dr. Lorin Picket Kayani Rapaport Assistant: Dr. Wyvonnia Lora  The patient has the above diagnoses and consented to the above procedure. Preoperative laboratory tests were normal. Preoperative antibiotics were given. Leg position was excellent to minimize the risk of compartment syndrome and neuropathy and deep vein thrombosis. A number of retractors were utilized to gain excellent exposure of the anterior vaginal wall. Palpably I felt the sling was here the proximal third of the urethra but I truly could not feel or see any extrusion. She may have had a few millimeters of extrusion or near extrusion near the left urethral vesical angle  Because of the planned surgery the suburethral incision was approximately 3 cm in length and I sharply developed good thick lateral flaps laterally. This allowed me to use my double-ring retractor as well. Staying just to the left of the midline I incised down and could feel the sling. I did a little bit more sharp dissection almost exposing the palpable sling. Staying away from the urethra I passed a small right angle around the sling. Almost in the midline I cut the sling with a scalpel releasing its 2 ends. A tonsil was applied to each end. I was very pleased with my sharp and minimal blunt dissection and mobilization of each end of the sling to the pelvic floor bilaterally. The axis of the sling on the left side was more cephalad vs. the right side. Dr. Edward Jolly and I were very careful to mobilize the sling away from the possible area of extrusion and dimpling of the anterior vaginal wall. Each end was cut and I could not feel any sling material at the level of the endopelvic fascia and  the tissue was soft and mobile  There was minimal bleeding and the tissues looked healthy so I planned to proceed with a pubovaginal sling. I could not actually find her suprapubic incisions so just above her symphysis pubis I made approximately a 4 cm incision and dissected down with retractors exposing the rectus fascia bilaterally. I was away from the external ring cognizant not to be near the ilioinguinal nerve and I was also close to the bone.  Using an appropriate strong right angle and the bladder always emptied I did my usual urethrolysis technique to break through the endopelvic fascia bilaterally. I stayed on the symphysis pubis periosteum bilaterally. It took a little bit more time on the right. When I would spread my right angle I stayed very lateral and I was cognizant not to injure the bladder or urethra that may have been thinned minimally from my previous surgery. I could easily pass my finger to the rectus fascia bilaterally and there was no bleeding.  After exposing the appropriate rectus fascia near bone away from the inguinal ring I passed the Stamey needle onto the pulp of my index finger and delivered 1 needle bilaterally. I then cystoscoped the patient. There is no injury to bladder or urethra. I very good look at 2 and 10:00. Trigone was normal.  Prior to breaking through the pelvic floor I prepared a 7 x 4 dermal sling and cut it to approxi-7 x 2 cm. I tapered each end and oversewed each with 0 Prolene suture with multiple passes. The sling was then delivered up through the retropubic space passing both Prolene sutures  through the single eye of the Stamey needle. With appropriate exposure I then used a Mayo needle to form a tissue bridge at the level of the rectus fascia medially again staying away from the external ring and ilioinguinal nerve The bridge was also very close to the bone.  I then cystoscoped the patient with the patient table parallel to the floor I used my usual  tensioning technique. I first did a bimanual exam and depressed the scope and I would scope parallel the floor and make certain there was not an over correction shelf. Using the cystoscope as a little bit of a counterweight I then tied the sutures bilaterally with approximately a 1 cm cube or perhaps a little bit smaller bilaterally. I cut each Prolene suture but left a single-stranded suture to tie tension-free in the midline.  I rescoped the patient and again I was very happy with the tension and position of the pubovaginal sling that was not under tension and covering the proximal two thirds of the urethra not too tight proximally. Irrigation was utilized for both incisions.  The abdominal incision was closed with 3-0 Vicryl followed by 4-0 subcuticular. I used 2-0 Vicryl a close the vaginal wall incision. One pack was utilized with Estrace cream. Leg position was excellent. Blood loss was less than 100 mL. Hopefully the patient will reach her treatment goal

## 2013-01-30 MED ORDER — PHENAZOPYRIDINE HCL 100 MG PO TABS: 200.0000 mg | ORAL_TABLET | Freq: Three times a day (TID) | ORAL | Status: AC

## 2013-01-30 NOTE — Progress Notes (Signed)
Patient had a vaginal surgery yesterday by Dr. Alfredo Martinez. I assisted and discussed the IFOB with the patient while she was in the hospital. She states no further bleeding. I encouraged her to still do this but only after completes healing from her vaginal surgery, in order to avoid a false positive.

## 2013-01-31 ENCOUNTER — Encounter (HOSPITAL_COMMUNITY): Payer: Self-pay | Admitting: Urology

## 2013-02-04 NOTE — Telephone Encounter (Signed)
Patient canceled her upcoming 1 week post op appointment 02/07/13. Patient says Dr. Edward Jolly told her she did not need to keep this appointment.

## 2013-02-04 NOTE — Telephone Encounter (Signed)
This is correct.

## 2013-02-07 ENCOUNTER — Ambulatory Visit: Payer: BC Managed Care – PPO | Admitting: Obstetrics and Gynecology

## 2013-02-13 ENCOUNTER — Ambulatory Visit (INDEPENDENT_AMBULATORY_CARE_PROVIDER_SITE_OTHER): Payer: BC Managed Care – PPO | Admitting: Cardiovascular Disease

## 2013-02-13 ENCOUNTER — Encounter: Payer: Self-pay | Admitting: Cardiovascular Disease

## 2013-02-13 VITALS — BP 122/72 | HR 70 | Ht 65.0 in | Wt 158.0 lb

## 2013-02-13 DIAGNOSIS — I491 Atrial premature depolarization: Secondary | ICD-10-CM

## 2013-02-13 DIAGNOSIS — I498 Other specified cardiac arrhythmias: Secondary | ICD-10-CM

## 2013-02-13 DIAGNOSIS — R000 Tachycardia, unspecified: Secondary | ICD-10-CM

## 2013-02-13 DIAGNOSIS — R002 Palpitations: Secondary | ICD-10-CM

## 2013-02-13 NOTE — Patient Instructions (Signed)
Your physician wants you to follow-up in:  12 months.  You will receive a reminder letter in the mail two months in advance. If you don't receive a letter, please call our office to schedule the follow-up appointment.   

## 2013-02-13 NOTE — Progress Notes (Signed)
History of Present Illness: 47 yo female with history of anxiety, arthritis here today for follow up. She was seen as a new patient 01/11/13 for evaluation of abnormal EKG. Her EKG on 11/14/12 showed sinus rhythm with incomplete RBBB, poor R wave progression precordial leads. She has had a recent neck surgery. She was told that her EKG was abnormal. She describes no chest pain or SOB but she has been noted to be tachycardic. HR was 150s per pt in her primary care office. She has been on Adderall but heart rate is better since starting this. Normal thyroid testing per pt. Used to be on a beta blocker for resting tachycardia. No dyspnea or LE swelling to suggest DVT or PE. She is a very anxious person. She feels her heart racing at night. I arranged an echo and 48 hour monitor.  Echo 01/22/13 showed normal LV size and function. Monitor showed periods of sinus tachycardiac with rare PACs and PVCs.   She is here today for follow up. She is feeling well. No chest pain or SOB. Occasionally feels her heart skip.   Primary Care Physician: Schoenhoff  Last Lipid Profile:Lipid Panel     Component Value Date/Time   CHOL 174 07/03/2012 1123   TRIG 164* 07/03/2012 1123   HDL 70 07/03/2012 1123   CHOLHDL 2.5 07/03/2012 1123   VLDL 33 07/03/2012 1123   LDLCALC 71 07/03/2012 1123     Past Medical History  Diagnosis Date  . Attention deficit disorder   . Anxiety   . Dyspareunia   . Urinary incontinence   . PONV (postoperative nausea and vomiting)   . Seasonal allergies   . SVD (spontaneous vaginal delivery)     x 2  . Dysrhythmia     currently being seen by Dr Sanjuana Kava  [phone]  . Headache(784.0)     due to neck - otc meds prn  . Arthritis     neck    Past Surgical History  Procedure Laterality Date  . Incontinence surgery  03/2002    [facility] sling--Dr. Di Kindle in  [facility]  . Abdominal hysterectomy  2001    [facility]-Dr. Miguel Aschoff  . Breast surgery    . Augmentation mammaplasty  2002    . Cervical fusion      c3-4  . Anterior cervical decompression/discectomy fusion 4 level/hardware removal N/A 11/16/2012    Procedure: Cervical Three-Four, Cervical Four-Five, Cervical Six-Seven, Cervical Seven-Thoracic One Anterior cervical decompression/diskectomy/fusion/possible removal of plate at A5-4;  Surgeon: Karn Cassis, MD;  Location: [facility];  Service: Neurosurgery;  Laterality: N/A;  ANTERIOR CERVICAL DECOMPRESSION/DISCECTOMY FUSION 4 LEVEL/HARDWARE REMOVAL  . Wisdom tooth extraction    . Pubovaginal sling N/A 01/29/2013    Procedure: REMOVAL OF SLING ; INSERTION OF XEROFORM GRAFT;  Surgeon: Martina Sinner, MD;  Location: [facility];  Service: Urology;  Laterality: N/A;  . Cystoscopy N/A 01/29/2013    Procedure: CYSTOSCOPY;  Surgeon: Martina Sinner, MD;  Location: [facility];  Service: Urology;  Laterality: N/A;    Current Outpatient Prescriptions  Medication Sig Dispense Refill  . amphetamine-dextroamphetamine (ADDERALL) 20 MG tablet Take 20 mg by mouth 3 (three) times daily.      . cetirizine (ZYRTEC) 10 MG tablet Take 10 mg by mouth as needed.       . diazepam (VALIUM) 5 MG tablet Take 5 mg by mouth at bedtime as needed.       Marland Kitchen HYDROcodone-acetaminophen (NORCO) 5-325  MG per tablet Take 1 tablet by mouth every 6 (six) hours as needed for moderate pain.  40 tablet  0  . venlafaxine (EFFEXOR) 75 MG tablet Take 75 mg by mouth daily.       No current facility-administered medications for this visit.    Allergies  Allergen Reactions  . Adhesive [Tape] Rash  . Latex Rash    History   Social History  . Marital Status: Married    Spouse Name: N/A    Number of Children: N/A  . Years of Education: N/A   Occupational History  . Not on file.   Social History Main Topics  . Smoking status: Never Smoker   . Smokeless tobacco: Never Used  . Alcohol Use: Yes     Comment: socially  . Drug Use: No  . Sexual Activity: Yes    Birth Control/ Protection: Surgical      Comment: [facility] 2001   Other Topics Concern  . Not on file   Social History Narrative  . No narrative on file    Family History  Problem Relation Age of Onset  . Depression Mother   . Asthma Mother   . Diabetes Father   . Hypertension Father   . Heart disease Father   . Kidney disease Father   . Breast cancer Maternal Grandmother   . Hypertension Maternal Grandmother   . Hypertension Maternal Grandfather   . Hypertension Paternal Grandfather   . Stroke Paternal Grandfather   . Heart failure Father   . CAD Paternal Uncle     Review of Systems:  As stated in the HPI and otherwise negative.   BP 122/72  Pulse 70  Ht 5\' 5"  (1.651 m)  Wt 158 lb (71.668 kg)  BMI 26.29 kg/m2  Physical Examination: General: Well developed, well nourished, NAD HEENT: OP clear, mucus membranes moist SKIN: warm, dry. No rashes. Neuro: No focal deficits Musculoskeletal: Muscle strength 5/5 all ext Psychiatric: Mood and affect normal Neck: No JVD, no carotid bruits, no thyromegaly, no lymphadenopathy. Lungs:Clear bilaterally, no wheezes, rhonci, crackles Cardiovascular: Regular rate and rhythm. No murmurs, gallops or rubs. Abdomen:Soft. Bowel sounds present. Non-tender.  Extremities: No lower extremity edema. Pulses are 2 + in the bilateral DP/PT.  Echo 01/22/13: Left ventricle: The cavity size was normal. Wall thickness was normal. The estimated ejection fraction was 55%. Wall motion was normal; there were no regional wall motion abnormalities. Diastolic function was not fully assessed. - Aortic valve: There was no stenosis. - Mitral valve: Mildly calcified annulus. Mildly calcified leaflets . No significant regurgitation. - Right ventricle: The cavity size was normal. Systolic function was normal. - Pulmonary arteries: PA peak pressure: 19mm Hg (S). - Inferior vena cava: The vessel was normal in size; the respirophasic diameter changes were in the normal range (= 50%); findings are  consistent with normal central venous pressure. Impressions: - Normal LV size with EF 55%. Normal RV size and systolic function. No significant valvular abnormalities.  Assessment and Plan:   1. Sinus tachycardia: She has resting tachycardia even before she was started on Adderall. Recent normal thyroid studies. No recent illnesses. Echo overall normal. 48 hour monitor with sinus tach, PACs, PVCs. She feels well. Discussed starting beta blocker but will not at this time as her resting heart rate is 70. She is asymptomatic.   2. Incomplete RBBB  3. Palpitations: Likely due to PACs, PVCs. Avoid stimulants.

## 2013-03-06 LAB — FECAL OCCULT BLOOD, IMMUNOCHEMICAL: Fecal Occult Blood: NEGATIVE

## 2013-03-11 ENCOUNTER — Ambulatory Visit: Payer: BC Managed Care – PPO | Admitting: Obstetrics and Gynecology

## 2013-12-13 ENCOUNTER — Other Ambulatory Visit: Payer: Self-pay

## 2013-12-30 ENCOUNTER — Encounter: Payer: Self-pay | Admitting: Cardiovascular Disease

## 2015-01-02 ENCOUNTER — Ambulatory Visit (INDEPENDENT_AMBULATORY_CARE_PROVIDER_SITE_OTHER): Payer: Self-pay | Admitting: Medical

## 2015-01-02 ENCOUNTER — Encounter: Payer: Self-pay | Admitting: Medical

## 2015-01-02 VITALS — BP 140/88 | HR 90 | Temp 97.3°F | Ht 65.0 in | Wt 179.0 lb

## 2015-01-02 DIAGNOSIS — R0683 Snoring: Secondary | ICD-10-CM

## 2015-01-02 DIAGNOSIS — IMO0001 Reserved for inherently not codable concepts without codable children: Secondary | ICD-10-CM

## 2015-01-02 DIAGNOSIS — R030 Elevated blood-pressure reading, without diagnosis of hypertension: Secondary | ICD-10-CM

## 2015-01-02 DIAGNOSIS — J309 Allergic rhinitis, unspecified: Secondary | ICD-10-CM

## 2015-01-02 DIAGNOSIS — J3489 Other specified disorders of nose and nasal sinuses: Secondary | ICD-10-CM

## 2015-01-02 MED ORDER — FLUTICASONE PROPIONATE 50 MCG/ACT NA SUSP: 2.0000 | Freq: Every day | NASAL | Status: DC

## 2015-01-02 MED ORDER — MONTELUKAST SODIUM 10 MG PO TABS: 10.0000 mg | ORAL_TABLET | Freq: Every day | ORAL | Status: DC

## 2015-01-02 MED ORDER — AZITHROMYCIN 250 MG PO TABS: ORAL_TABLET | ORAL | Status: DC

## 2015-01-02 NOTE — Assessment & Plan Note (Signed)
Will rx montelukast, and flonase. Continue zyrtec.

## 2015-01-02 NOTE — Patient Instructions (Addendum)
Will rx montelukast, and flonase. Continue zyrtec.  Check bp daily and document readings. Follow up in 2 wks with readings. Dash diet.  Until we get idea on bp reading would not recommend ADD med since can increase bp.  Will refer you to pulmonologist. I will refer you to other than prior if you wish. Dr. Vassie Loll may be quickest route since you saw him last.  For your sinus pressure and lt tm that is that is red. If pain worsens then start azithromycin.  Follow up in 2-3 wks or as needed  DASH Eating Plan DASH stands for "Dietary Approaches to Stop Hypertension." The DASH eating plan is a healthy eating plan that has been shown to reduce high blood pressure (hypertension). Additional health benefits may include reducing the risk of type 2 diabetes mellitus, heart disease, and stroke. The DASH eating plan may also help with weight loss. WHAT DO I NEED TO KNOW ABOUT THE DASH EATING PLAN? For the DASH eating plan, you will follow these general guidelines:  Choose foods with a percent daily value for sodium of less than 5% (as listed on the food label).  Use salt-free seasonings or herbs instead of table salt or sea salt.  Check with your health care provider or pharmacist before using salt substitutes.  Eat lower-sodium products, often labeled as "lower sodium" or "no salt added."  Eat fresh foods.  Eat more vegetables, fruits, and low-fat dairy products.  Choose whole grains. Look for the word "whole" as the first word in the ingredient list.  Choose fish and skinless chicken or [place] more often than red meat. Limit fish, poultry, and meat to 6 oz (170 g) each day.  Limit sweets, desserts, sugars, and sugary drinks.  Choose heart-healthy fats.  Limit cheese to 1 oz (28 g) per day.  Eat more home-cooked food and less restaurant, buffet, and fast food.  Limit fried foods.  Cook foods using methods other than frying.  Limit canned vegetables. If you do use them, rinse them well  to decrease the sodium.  When eating at a restaurant, ask that your food be prepared with less salt, or no salt if possible. WHAT FOODS CAN I EAT? Seek help from a dietitian for individual calorie needs. Grains Whole grain or whole wheat bread. Brown rice. Whole grain or whole wheat pasta. Quinoa, bulgur, and whole grain cereals. Low-sodium cereals. Corn or whole wheat flour tortillas. Whole grain cornbread. Whole grain crackers. Low-sodium crackers. Vegetables Fresh or frozen vegetables (raw, steamed, roasted, or grilled). Low-sodium or reduced-sodium tomato and vegetable juices. Low-sodium or reduced-sodium tomato sauce and paste. Low-sodium or reduced-sodium canned vegetables.  Fruits All fresh, canned (in natural juice), or frozen fruits. Meat and Other Protein Products Ground beef (85% or leaner), grass-fed beef, or beef trimmed of fat. Skinless chicken or [place]. Ground chicken or [place]. Pork trimmed of fat. All fish and seafood. Eggs. Dried beans, peas, or lentils. Unsalted nuts and seeds. Unsalted canned beans. Dairy Low-fat dairy products, such as skim or 1% milk, 2% or reduced-fat cheeses, low-fat ricotta or cottage cheese, or plain low-fat yogurt. Low-sodium or reduced-sodium cheeses. Fats and Oils Tub margarines without trans fats. Light or reduced-fat mayonnaise and salad dressings (reduced sodium). Avocado. Safflower, olive, or canola oils. Natural peanut or almond butter. Other Unsalted popcorn and pretzels. The items listed above may not be a complete list of recommended foods or beverages. Contact your dietitian for more options. WHAT FOODS ARE NOT RECOMMENDED? Grains White bread. White  pasta. White rice. Refined cornbread. Bagels and croissants. Crackers that contain trans fat. Vegetables Creamed or fried vegetables. Vegetables in a cheese sauce. Regular canned vegetables. Regular canned tomato sauce and paste. Regular tomato and vegetable juices. Fruits Dried fruits.  Canned fruit in light or heavy syrup. Fruit juice. Meat and Other Protein Products Fatty cuts of meat. Ribs, chicken wings, bacon, sausage, bologna, salami, chitterlings, fatback, hot dogs, bratwurst, and packaged luncheon meats. Salted nuts and seeds. Canned beans with salt. Dairy Whole or 2% milk, cream, half-and-half, and cream cheese. Whole-fat or sweetened yogurt. Full-fat cheeses or blue cheese. Nondairy creamers and whipped toppings. Processed cheese, cheese spreads, or cheese curds. Condiments Onion and garlic salt, seasoned salt, table salt, and sea salt. Canned and packaged gravies. Worcestershire sauce. Tartar sauce. Barbecue sauce. Teriyaki sauce. Soy sauce, including reduced sodium. Steak sauce. Fish sauce. Oyster sauce. Cocktail sauce. Horseradish. Ketchup and mustard. Meat flavorings and tenderizers. Bouillon cubes. Hot sauce. Tabasco sauce. Marinades. Taco seasonings. Relishes. Fats and Oils Butter, stick margarine, lard, shortening, ghee, and bacon fat. Coconut, palm kernel, or palm oils. Regular salad dressings. Other Pickles and olives. Salted popcorn and pretzels. The items listed above may not be a complete list of foods and beverages to avoid. Contact your dietitian for more information. WHERE CAN I FIND MORE INFORMATION? National Heart, Lung, and Blood Institute: CablePromo.itwww.nhlbi.nih.gov/health/health-topics/topics/dash/   This information is not intended to replace advice given to you by your health care provider. Make sure you discuss any questions you have with your health care provider.   Document Released: 02/03/2011 Document Revised: 03/07/2014 Document Reviewed: 12/19/2012 Elsevier Interactive Patient Education [employer]2016 Elsevier Inc.

## 2015-01-02 NOTE — Progress Notes (Signed)
Subjective:    Patient ID: Kaitlyn Carter, female    DOB: 14-Feb-1966, 49 y.o.   MRN: [id number]  HPI   I have reviewed pt PMH, PSH, FH, Social History and Surgical History  [employer], she does not exercise, Not eating healthy(stress eating), 2 cups Coffee in morning, married- 8 yo child at home.  Allergic rhinitis- 2 days of nasal congestion. Some cough at night. Some sneezing. Pt takes zyrtec year round. No nasal sprays. Fall is worse season. Pt recently not wheezing. Pt allergic to a lot of things. She states everything per allergy test. She states zyrtec does not keep symptoms under control year round.  Pt has been checking bp with electronic cuff. Her bp has been 150/99 last night at pharmacy. Then with father in laws bp cuff was the same. Pt did take some cold and flu med this am. But no other decongestant type meds otc.  Pt has history of ADD. She ran out of this 3 wks ago. Has not been on for a while/not new.  Pt states she wakes up in middle of night frequently. She keeps waking up frequently. The other night counted 11 times. Pt does snore. Relatives states she does snore. But not terrible.    Review of Systems  Constitutional: Negative for fever, chills and fatigue.  HENT: Positive for congestion, postnasal drip and sinus pressure. Negative for ear pain and rhinorrhea.        Ear pressure.  Respiratory: Positive for cough. Negative for wheezing.   Cardiovascular: Negative for chest pain and palpitations.  Psychiatric/Behavioral: Positive for sleep disturbance and decreased concentration. Negative for behavioral problems and dysphoric mood. The patient is nervous/anxious.        Hx of add.  Stress with work.  Snores.    Past Medical History  Diagnosis Date  . Attention deficit disorder   . Anxiety   . Dyspareunia   . Urinary incontinence   . PONV (postoperative nausea and vomiting)   . Seasonal allergies   . SVD (spontaneous vaginal delivery)    x 2  . Dysrhythmia     currently being seen by Dr Sanjuana Kava  [phone]  . Headache(784.0)     due to neck - otc meds prn  . Arthritis     neck    Social History   Social History  . Marital Status: Married    Spouse Name: N/A  . Number of Children: N/A  . Years of Education: N/A   Occupational History  . Not on file.   Social History Main Topics  . Smoking status: Never Smoker   . Smokeless tobacco: Never Used  . Alcohol Use: Yes     Comment: socially  . Drug Use: No  . Sexual Activity: Yes    Birth Control/ Protection: Surgical     Comment: [facility] 2001   Other Topics Concern  . Not on file   Social History Narrative    Past Surgical History  Procedure Laterality Date  . Incontinence surgery  03/2002    [facility] sling--Dr. Di Kindle in  [facility]  . Abdominal hysterectomy  2001    [facility]-Dr. Miguel Aschoff  . Breast surgery    . Augmentation mammaplasty  2002  . Cervical fusion      c3-4  . Anterior cervical decompression/discectomy fusion 4 level/hardware removal N/A 11/16/2012    Procedure: Cervical Three-Four, Cervical Four-Five, Cervical Six-Seven, Cervical Seven-Thoracic One Anterior cervical decompression/diskectomy/fusion/possible removal of plate at O9-6;  Surgeon: Karn CassisErnesto M Botero, MD;  [facility];  Service: Neurosurgery;  Laterality: N/A;  ANTERIOR CERVICAL DECOMPRESSION/DISCECTOMY FUSION 4 LEVEL/HARDWARE REMOVAL  . Wisdom tooth extraction    . Pubovaginal sling N/A 01/29/2013    Procedure: REMOVAL OF SLING ; INSERTION OF XEROFORM GRAFT;  Surgeon: Martina SinnerScott A MacDiarmid, MD;  Location: [facility];  Service: Urology;  Laterality: N/A;  . Cystoscopy N/A 01/29/2013    Procedure: CYSTOSCOPY;  Surgeon: Martina SinnerScott A MacDiarmid, MD;  Location: [facility];  Service: Urology;  Laterality: N/A;    Family History  Problem Relation Age of Onset  . Depression Mother   . Asthma Mother   . Diabetes Father   . Hypertension Father   . Heart disease Father   . Kidney disease  Father   . Breast cancer Maternal Grandmother   . Hypertension Maternal Grandmother   . Hypertension Maternal Grandfather   . Hypertension Paternal Grandfather   . Stroke Paternal Grandfather   . Heart failure Father   . CAD Paternal Uncle     Allergies  Allergen Reactions  . Adhesive [Tape] Rash  . Latex Rash    Current Outpatient Prescriptions on File Prior to Visit  Medication Sig Dispense Refill  . amphetamine-dextroamphetamine (ADDERALL) 20 MG tablet Take 30 mg by mouth 2 (two) times daily.     . cetirizine (ZYRTEC) 10 MG tablet Take 10 mg by mouth as needed.     . diazepam (VALIUM) 5 MG tablet Take 5 mg by mouth at bedtime as needed.     Marland Kitchen. HYDROcodone-acetaminophen (NORCO) 5-325 MG per tablet Take 1 tablet by mouth every 6 (six) hours as needed for moderate pain. 40 tablet 0  . venlafaxine (EFFEXOR) 75 MG tablet Take 150 mg by mouth 1 day or 1 dose.      No current facility-administered medications on file prior to visit.    BP 126/84 mmHg  Pulse 90  Temp(Src) 97.3 F (36.3 C) (Oral)  Ht 5\' 5"  (1.651 m)  Wt 179 lb (81.194 kg)  BMI 29.79 kg/m2  SpO2 98%       Objective:   Physical Exam  General Mental Status- Alert. General Appearance- Not in acute distress.    HEENT Head- Normal. Ear Auditory Canal - Left- Normal. Right - Normal.Tympanic Membrane- Left- Normal. Right- Normal. Eye Sclera/Conjunctiva- Left- Normal. Right- Normal. Nose & Sinuses Nasal Mucosa- Left-  Boggy and Congested. Right-  Boggy and  Congested.Bilateral maxillary and  No frontal sinus pressure. Mouth & Throat Lips: Upper Lip- Normal: no dryness, cracking, pallor, cyanosis, or vesicular eruption. Lower Lip-Normal: no dryness, cracking, pallor, cyanosis or vesicular eruption. Buccal Mucosa- Bilateral- No Aphthous ulcers. Oropharynx- No Discharge or Erythema. +pnd. Tonsils: Characteristics- Bilateral- No Erythema or Congestion. Size/Enlargement- Bilateral- No enlargement. Discharge-  bilateral-None.  Skin General: Color- Normal Color. Moisture- Normal Moisture.  Neck Carotid Arteries- Normal color. Moisture- Normal Moisture. No carotid bruits. No JVD.  Chest and Lung Exam Auscultation: Breath Sounds:-Normal.  Cardiovascular Auscultation:Rythm- Regular. Murmurs & Other Heart Sounds:Auscultation of the heart reveals- No Murmurs.  Abdomen Inspection:-Inspeection Normal. Palpation/Percussion:Note:No mass. Palpation and Percussion of the abdomen reveal- Non Tender, Non Distended + BS, no rebound or guarding.  Neurologic Cranial Nerve exam:- CN III-XII intact(No nystagmus), symmetric smile. Strength:- 5/5 equal and symmetric strength both upper and lower extremities.      Assessment & Plan:  Will rx montelukast, and flonase. Continue zyrtec.  Check bp daily and document readings. Follow up in 2 wks with readings. Sharilyn Sitesash  diet.  Until we get idea on bp reading would not recommend ADD med since can increase bp.  Will refer you to pulmonologist. I will refer you to other than prior if you wish. Dr. Vassie Loll may be quickest route since you saw him last.  For your sinus pressure and lt tm that is that is red. If pain worsens then start azithromycin.  Follow up in 2-3 wks or as needed

## 2015-01-02 NOTE — Progress Notes (Signed)
Pre visit review using our clinic review tool, if applicable. No additional management support is needed unless otherwise documented below in the visit note. 

## 2015-01-13 ENCOUNTER — Ambulatory Visit (INDEPENDENT_AMBULATORY_CARE_PROVIDER_SITE_OTHER): Payer: Self-pay | Admitting: Adult Health

## 2015-01-13 ENCOUNTER — Telehealth: Payer: Self-pay | Admitting: Pulmonary Disease

## 2015-01-13 ENCOUNTER — Encounter: Payer: Self-pay | Admitting: Adult Health

## 2015-01-13 VITALS — BP 112/82 | HR 89 | Temp 98.3°F | Ht 65.0 in | Wt 177.0 lb

## 2015-01-13 DIAGNOSIS — G4719 Other hypersomnia: Secondary | ICD-10-CM

## 2015-01-13 DIAGNOSIS — R0683 Snoring: Secondary | ICD-10-CM

## 2015-01-13 NOTE — Progress Notes (Signed)
Reviewed & agree with plan  

## 2015-01-13 NOTE — Telephone Encounter (Signed)
Per OV with TP today: Patient Instructions       We are setting you up for a home sleep study   Avoid sedating medications if possible   Follow up Dr. Vassie LollAlva  In  6-8  weeks and As needed     ---  Dr. Vassie LollAlva you do not have anything available until February. Is it okay to schedule appt that far out?

## 2015-01-13 NOTE — Progress Notes (Signed)
Subjective:    Patient ID: Kaitlyn Carter, female    DOB: 1965/03/05, 49 y.o.   MRN: [id number]  HPI 49 year-old woman referred for evaluation of  For daytime sleepiness and snoring in  07/2012  With Dr. Vassie Loll  .   01/13/2015 Follow up : Sleep issues  Pt returns for follow up for sleep issues.  Seen 2 years ago for snoring , and witnessed apneas. She was set up for a HST but insurance denied it.  She says her symptoms have gotten worse over last 2 year. She now has significant daytime sleepiness, poor concentration, wakes up frequently during night gasping for air. Family members have witnessed apneic events and she has snoring.  She has hx of neck surgery x2 , says last surgery seems her sleep issues worsens.  She takes rare pain meds .  Goes to bed around 10 pm and wakes up around 6am. Has several awakening throughout the night.  She works in Airline [employer].  Epworth sleepiness score is 8 (up from 4.-2 years ago)  There is no history suggestive of cataplexy, sleep paralysis or parasomnias She denies chest pain, orthopnea or edema.  Pt does not have any insurance .   Past Medical History  Diagnosis Date  . Attention deficit disorder   . Anxiety   . Dyspareunia   . Urinary incontinence   . PONV (postoperative nausea and vomiting)   . Seasonal allergies   . SVD (spontaneous vaginal delivery)     x 2  . Dysrhythmia     currently being seen by Dr Sanjuana Kava  [phone]  . Headache(784.0)     due to neck - otc meds prn  . Arthritis     neck   Current Outpatient Prescriptions on File Prior to Visit  Medication Sig Dispense Refill  . amphetamine-dextroamphetamine (ADDERALL) 20 MG tablet Take 30 mg by mouth 2 (two) times daily.     . diazepam (VALIUM) 5 MG tablet Take 5 mg by mouth at bedtime as needed.     . fluticasone (FLONASE) 50 MCG/ACT nasal spray Place 2 sprays into both nostrils daily. 16 g 1  . montelukast (SINGULAIR) 10 MG tablet Take 1 tablet (10 mg total) by mouth at bedtime. 30  tablet 3  . venlafaxine (EFFEXOR) 75 MG tablet Take 150 mg by mouth 1 day or 1 dose.     Marland Kitchen HYDROcodone-acetaminophen (NORCO) 5-325 MG per tablet Take 1 tablet by mouth every 6 (six) hours as needed for moderate pain. (Patient not taking: Reported on 01/13/2015) 40 tablet 0   No current facility-administered medications on file prior to visit.     Review of Systems Constitutional:   No  weight loss, night sweats,  Fevers, chills + fatigue, or  lassitude.  HEENT:   No headaches,  Difficulty swallowing,  Tooth/dental problems, or  Sore throat,                No sneezing, itching, ear ache, nasal congestion, post nasal drip,   CV:  No chest pain,  Orthopnea, PND, swelling in lower extremities, anasarca, dizziness, palpitations, syncope.   GI  No heartburn, indigestion, abdominal pain, nausea, vomiting, diarrhea, change in bowel habits, loss of appetite, bloody stools.   Resp: No shortness of breath with exertion or at rest.  No excess mucus, no productive cough,  No non-productive cough,  No coughing up of blood.  No change in color of mucus.  No wheezing.  No chest wall deformity  Skin: no rash or lesions.  GU: no dysuria, change in color of urine, no urgency or frequency.  No flank pain, no hematuria   MS:  No joint pain or swelling.  No decreased range of motion.  No back pain.  Psych:  No change in mood or affect. No depression or anxiety.  No memory loss.         Objective:   Physical Exam GEN: A/Ox3; pleasant , NAD, overweight  VS reviewed   HEENT:  [place]/AT,  EACs-clear, TMs-wnl, NOSE-clear, THROAT-clear, no lesions, no postnasal drip or exudate noted. Class 2 MP airway , elongated uvula   NECK:  Supple w/ fair ROM; no JVD; normal carotid impulses w/o bruits; no thyromegaly or nodules palpated; no lymphadenopathy.  RESP  Clear  P & A; w/o, wheezes/ rales/ or rhonchi.no accessory muscle use, no dullness to percussion  CARD:  RRR, no m/r/g  , no peripheral edema, pulses  intact, no cyanosis or clubbing.  GI:   Soft & nt; nml bowel sounds; no organomegaly or masses detected.  Musco: Warm bil, no deformities or joint swelling noted.   Neuro: alert, no focal deficits noted.    Skin: Warm, no lesions or rashes        Assessment & Plan:

## 2015-01-13 NOTE — Telephone Encounter (Signed)
Can review home sleep test then decide

## 2015-01-13 NOTE — Assessment & Plan Note (Signed)
Excessive daytime sleepiness, snoring and witnessed apneic  Suggestive of OSA .  Set up HST   Plan  We are setting you up for a home sleep study  Avoid sedating medications if possible  Follow up Dr. Vassie LollAlva  In  6-8  weeks and As needed

## 2015-01-13 NOTE — Patient Instructions (Signed)
We are setting you up for a home sleep study  Avoid sedating medications if possible  Follow up Dr. Vassie LollAlva  In  6-8  weeks and As needed

## 2015-01-14 NOTE — Telephone Encounter (Signed)
LMTCB

## 2015-01-15 NOTE — Telephone Encounter (Signed)
Called and spoke with pt. Informed her of RA's recs. Pt voiced understanding and had no further questions. Nothing further needed.

## 2015-01-27 ENCOUNTER — Telehealth: Payer: Self-pay | Admitting: Adult Health

## 2015-01-27 NOTE — Telephone Encounter (Signed)
Per TP Schedule patient for ov with TP to discuss sleep study  LVM for pt to return call.

## 2015-01-28 NOTE — Telephone Encounter (Signed)
Called and spoke with pt. Informed her of TP's recs. I scheduled patient with TP on 02/16/15 at 11:45. Patient voiced understanding and had no further questions. Nothing further needed. Will sign off on message.

## 2015-02-09 ENCOUNTER — Encounter: Payer: Self-pay | Admitting: Obstetrics and Gynecology

## 2015-02-09 ENCOUNTER — Ambulatory Visit (INDEPENDENT_AMBULATORY_CARE_PROVIDER_SITE_OTHER): Payer: Self-pay | Admitting: Obstetrics and Gynecology

## 2015-02-09 VITALS — BP 124/82 | HR 110 | Ht 65.0 in | Wt 165.6 lb

## 2015-02-09 DIAGNOSIS — Z113 Encounter for screening for infections with a predominantly sexual mode of transmission: Secondary | ICD-10-CM

## 2015-02-09 DIAGNOSIS — R300 Dysuria: Secondary | ICD-10-CM

## 2015-02-09 DIAGNOSIS — M25559 Pain in unspecified hip: Secondary | ICD-10-CM

## 2015-02-09 LAB — POCT URINALYSIS DIPSTICK
BILIRUBIN UA: NEGATIVE
GLUCOSE UA: NEGATIVE
Leukocytes, UA: NEGATIVE
Nitrite, UA: NEGATIVE
Protein, UA: NEGATIVE
RBC UA: NEGATIVE
Urobilinogen, UA: NEGATIVE
pH, UA: 5
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
## 2015-02-10 LAB — HEPATITIS C ANTIBODY: HCV AB: NEGATIVE

## 2015-02-10 LAB — WET PREP BY MOLECULAR PROBE
CANDIDA SPECIES: NEGATIVE
GARDNERELLA VAGINALIS: NEGATIVE
TRICHOMONAS VAG: NEGATIVE

## 2015-02-10 LAB — STD PANEL
HEP B S AG: NEGATIVE
HIV 1&2 Ab, 4th Generation: NONREACTIVE

## 2015-02-11 LAB — IPS N GONORRHOEA AND CHLAMYDIA BY PCR

## 2015-02-11 LAB — HSV(HERPES SIMPLEX VRS) I + II AB-IGG
HSV 1 Glycoprotein G Ab, IgG: 7.31 IV — ABNORMAL HIGH
HSV 2 Glycoprotein G Ab, IgG: 0.7 IV

## 2015-02-12 ENCOUNTER — Encounter: Payer: Self-pay | Admitting: Obstetrics and Gynecology

## 2015-02-16 ENCOUNTER — Encounter: Payer: Self-pay | Admitting: Adult Health

## 2015-02-16 ENCOUNTER — Ambulatory Visit (INDEPENDENT_AMBULATORY_CARE_PROVIDER_SITE_OTHER): Payer: Self-pay | Admitting: Adult Health

## 2015-02-16 VITALS — BP 132/88 | HR 86 | Temp 98.2°F | Ht 65.0 in | Wt 164.0 lb

## 2015-02-16 DIAGNOSIS — G4733 Obstructive sleep apnea (adult) (pediatric): Secondary | ICD-10-CM | POA: Insufficient documentation

## 2015-02-16 NOTE — Progress Notes (Signed)
Subjective:    Patient ID: Kaitlyn Carter, female    DOB: 16-Dec-1965, 49 y.o.   MRN: [id number]  HPI 49 year-old woman referred for evaluation of  For daytime sleepiness and snoring in  07/2012  With Dr. Vassie Loll  .   02/16/2015 Follow up : Sleep issues  Pt returns for 1 month follow up for sleep issues.  Pt was seen last visit for snoring and witnessed apneas.  She has hx of neck surgery x2 , says last surgery seems her sleep issues worsens.  She takes rare pain meds .  Pt does not have any insurance .  She was set up for a HST on 01/13/15 that showed AHI 13.4/hr , she did have mixed events with +centrals evident. SaO2 low at 82%.  We discussed her study results and treatment options She would like to begin nocturnal CPAP.  Discussed CPAP titration study at sleep lab however she does not think she can afford.  She says her family member has a machine . She does not have insurance .    Past Medical History  Diagnosis Date  . Attention deficit disorder   . Anxiety   . Dyspareunia   . Urinary incontinence   . PONV (postoperative nausea and vomiting)   . Seasonal allergies   . SVD (spontaneous vaginal delivery)     x 2  . Dysrhythmia     currently being seen by Dr Sanjuana Kava  [phone]  . Headache(784.0)     due to neck - otc meds prn  . Arthritis     neck  . Herpes simplex type 1 infection    Current Outpatient Prescriptions on File Prior to Visit  Medication Sig Dispense Refill  . amphetamine-dextroamphetamine (ADDERALL) 20 MG tablet Take 30 mg by mouth 2 (two) times daily.     . diazepam (VALIUM) 5 MG tablet Take 5 mg by mouth at bedtime as needed.     . fluticasone (FLONASE) 50 MCG/ACT nasal spray Place 2 sprays into both nostrils daily. 16 g 1  . montelukast (SINGULAIR) 10 MG tablet Take 1 tablet (10 mg total) by mouth at bedtime. 30 tablet 3  . venlafaxine (EFFEXOR) 75 MG tablet Take 150 mg by mouth 1 day or 1 dose.      No current facility-administered medications on file  prior to visit.     Review of Systems Constitutional:   No  weight loss, night sweats,  Fevers, chills + fatigue, or  lassitude.  HEENT:   No headaches,  Difficulty swallowing,  Tooth/dental problems, or  Sore throat,                No sneezing, itching, ear ache, nasal congestion, post nasal drip,   CV:  No chest pain,  Orthopnea, PND, swelling in lower extremities, anasarca, dizziness, palpitations, syncope.   GI  No heartburn, indigestion, abdominal pain, nausea, vomiting, diarrhea, change in bowel habits, loss of appetite, bloody stools.   Resp: No shortness of breath with exertion or at rest.  No excess mucus, no productive cough,  No non-productive cough,  No coughing up of blood.  No change in color of mucus.  No wheezing.  No chest wall deformity  Skin: no rash or lesions.  GU: no dysuria, change in color of urine, no urgency or frequency.  No flank pain, no hematuria   MS:  No joint pain or swelling.  No decreased range of motion.  No back pain.  Psych:  No  change in mood or affect. No depression or anxiety.  No memory loss.         Objective:   Physical Exam GEN: A/Ox3; pleasant , NAD, overweight  VS reviewed   HEENT:  [place]/AT,  EACs-clear, TMs-wnl, NOSE-clear, THROAT-clear, no lesions, no postnasal drip or exudate noted. Class 2 MP airway , elongated uvula   NECK:  Supple w/ fair ROM; no JVD; normal carotid impulses w/o bruits; no thyromegaly or nodules palpated; no lymphadenopathy.  RESP  Clear  P & A; w/o, wheezes/ rales/ or rhonchi.no accessory muscle use, no dullness to percussion  CARD:  RRR, no m/r/g  , no peripheral edema, pulses intact, no cyanosis or clubbing.  GI:   Soft & nt; nml bowel sounds; no organomegaly or masses detected.  Musco: Warm bil, no deformities or joint swelling noted.   Neuro: alert, no focal deficits noted.    Skin: Warm, no lesions or rashes        Assessment & Plan:

## 2015-02-16 NOTE — Patient Instructions (Signed)
Begin CPAP At bedtime  .  Goal is to wear for at least 4-6 hrs each night .  Do not drive if sleepy.  Follow up  In 6 weeks with download.  Follow up with Dr. Vassie LollAlva  In 4 months and As needed

## 2015-02-16 NOTE — Assessment & Plan Note (Signed)
Moderate OSA   Plan  Begin CPAP At bedtime  .  Goal is to wear for at least 4-6 hrs each night .  Do not drive if sleepy.  Follow up  In 6 weeks with download.  Follow up with Dr. Vassie LollAlva  In 4 months and As needed

## 2015-03-03 NOTE — Progress Notes (Signed)
Reviewed & agree with plan  

## 2015-03-07 ENCOUNTER — Emergency Department (HOSPITAL_COMMUNITY)
Admission: EM | Admit: 2015-03-07 | Discharge: 2015-03-09 | Disposition: A | Discharge status: Other Acute Inpatient Hospital | Payer: Self-pay | Attending: Emergency Medicine | Admitting: Emergency Medicine

## 2015-03-07 ENCOUNTER — Encounter (HOSPITAL_COMMUNITY): Payer: Self-pay | Admitting: Nurse Practitioner

## 2015-03-07 DIAGNOSIS — Z8739 Personal history of other diseases of the musculoskeletal system and connective tissue: Secondary | ICD-10-CM | POA: Insufficient documentation

## 2015-03-07 DIAGNOSIS — Z8679 Personal history of other diseases of the circulatory system: Secondary | ICD-10-CM | POA: Insufficient documentation

## 2015-03-07 DIAGNOSIS — Z8619 Personal history of other infectious and parasitic diseases: Secondary | ICD-10-CM | POA: Insufficient documentation

## 2015-03-07 DIAGNOSIS — F329 Major depressive disorder, single episode, unspecified: Secondary | ICD-10-CM

## 2015-03-07 DIAGNOSIS — Z91040 Latex allergy status: Secondary | ICD-10-CM | POA: Insufficient documentation

## 2015-03-07 DIAGNOSIS — R45851 Suicidal ideations: Secondary | ICD-10-CM | POA: Insufficient documentation

## 2015-03-07 DIAGNOSIS — F419 Anxiety disorder, unspecified: Secondary | ICD-10-CM | POA: Insufficient documentation

## 2015-03-07 DIAGNOSIS — F332 Major depressive disorder, recurrent severe without psychotic features: Secondary | ICD-10-CM | POA: Insufficient documentation

## 2015-03-07 DIAGNOSIS — Z79899 Other long term (current) drug therapy: Secondary | ICD-10-CM | POA: Insufficient documentation

## 2015-03-07 DIAGNOSIS — F32A Depression, unspecified: Secondary | ICD-10-CM

## 2015-03-07 DIAGNOSIS — F909 Attention-deficit hyperactivity disorder, unspecified type: Secondary | ICD-10-CM | POA: Insufficient documentation

## 2015-03-07 DIAGNOSIS — F411 Generalized anxiety disorder: Secondary | ICD-10-CM | POA: Diagnosis present
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
## 2015-03-08 DIAGNOSIS — F419 Anxiety disorder, unspecified: Secondary | ICD-10-CM | POA: Diagnosis present

## 2015-03-08 DIAGNOSIS — F332 Major depressive disorder, recurrent severe without psychotic features: Secondary | ICD-10-CM

## 2015-03-08 DIAGNOSIS — R45851 Suicidal ideations: Secondary | ICD-10-CM

## 2015-03-08 DIAGNOSIS — F411 Generalized anxiety disorder: Secondary | ICD-10-CM | POA: Diagnosis present

## 2015-03-08 LAB — CBC
HEMATOCRIT: 45.4 % (ref 36.0–46.0)
HEMOGLOBIN: 15.3 g/dL — AB (ref 12.0–15.0)
MCH: 31.6 pg (ref 26.0–34.0)
MCHC: 33.7 g/dL (ref 30.0–36.0)
MCV: 93.8 fL (ref 78.0–100.0)
Platelets: 298 10*3/uL (ref 150–400)
RBC: 4.84 MIL/uL (ref 3.87–5.11)
RDW: 12.8 % (ref 11.5–15.5)
WBC: 9.7 10*3/uL (ref 4.0–10.5)

## 2015-03-08 LAB — ACETAMINOPHEN LEVEL

## 2015-03-08 LAB — RAPID URINE DRUG SCREEN, HOSP PERFORMED
Amphetamines: POSITIVE — AB
BARBITURATES: NOT DETECTED
Benzodiazepines: POSITIVE — AB
COCAINE: NOT DETECTED
Opiates: NOT DETECTED
TETRAHYDROCANNABINOL: NOT DETECTED

## 2015-03-08 LAB — COMPREHENSIVE METABOLIC PANEL
ALBUMIN: 4.2 g/dL (ref 3.5–5.0)
ALK PHOS: 62 U/L (ref 38–126)
ALT: 21 U/L (ref 14–54)
AST: 21 U/L (ref 15–41)
Anion gap: 12 (ref 5–15)
BILIRUBIN TOTAL: 0.4 mg/dL (ref 0.3–1.2)
BUN: 5 mg/dL — AB (ref 6–20)
CALCIUM: 9.8 mg/dL (ref 8.9–10.3)
CO2: 27 mmol/L (ref 22–32)
CREATININE: 0.83 mg/dL (ref 0.44–1.00)
Chloride: 105 mmol/L (ref 101–111)
GFR calc Af Amer: >60 mL/min (ref >60)
GFR calc non Af Amer: >60 mL/min (ref >60)
GLUCOSE: 123 mg/dL — AB (ref 65–99)
Potassium: 3.7 mmol/L (ref 3.5–5.1)
SODIUM: 144 mmol/L (ref 135–145)
Total Protein: 7.6 g/dL (ref 6.5–8.1)

## 2015-03-08 LAB — ETHANOL: Alcohol, Ethyl (B): <5 mg/dL (ref <5)

## 2015-03-08 LAB — CBG MONITORING, ED: GLUCOSE-CAPILLARY: 103 mg/dL — AB (ref 65–99)

## 2015-03-08 LAB — SALICYLATE LEVEL: Salicylate Lvl: <4 mg/dL (ref 2.8–30.0)

## 2015-03-08 MED ORDER — GABAPENTIN 300 MG PO CAPS
300.0000 mg | ORAL_CAPSULE | Freq: Every day | ORAL | Status: DC
  Administered 2015-03-08: 300 mg via ORAL
  Filled 2015-03-08: qty 1

## 2015-03-08 MED ORDER — LORAZEPAM 0.5 MG PO TABS: 0.5000 mg | ORAL_TABLET | Freq: Four times a day (QID) | ORAL | Status: DC | PRN

## 2015-03-08 MED ORDER — VENLAFAXINE HCL ER 150 MG PO CP24
150.0000 mg | ORAL_CAPSULE | Freq: Every day | ORAL | Status: DC
  Administered 2015-03-09: 150 mg via ORAL
  Filled 2015-03-08 (×2): qty 1

## 2015-03-08 MED ORDER — HYDROXYZINE HCL 25 MG PO TABS
25.0000 mg | ORAL_TABLET | Freq: Four times a day (QID) | ORAL | Status: DC | PRN
  Administered 2015-03-08: 25 mg via ORAL
  Filled 2015-03-08: qty 1

## 2015-03-08 MED ORDER — ZOLPIDEM TARTRATE 5 MG PO TABS: 5.0000 mg | ORAL_TABLET | Freq: Every evening | ORAL | Status: DC | PRN

## 2015-03-08 MED ORDER — LORAZEPAM 1 MG PO TABS: 1.0000 mg | ORAL_TABLET | Freq: Three times a day (TID) | ORAL | Status: DC | PRN

## 2015-03-08 MED ORDER — ACETAMINOPHEN 325 MG PO TABS: 650.0000 mg | ORAL_TABLET | ORAL | Status: DC | PRN

## 2015-03-08 MED ORDER — IBUPROFEN 200 MG PO TABS: 600.0000 mg | ORAL_TABLET | Freq: Three times a day (TID) | ORAL | Status: DC | PRN

## 2015-03-08 MED ORDER — ONDANSETRON HCL 4 MG PO TABS: 4.0000 mg | ORAL_TABLET | Freq: Three times a day (TID) | ORAL | Status: DC | PRN

## 2015-03-08 NOTE — ED Notes (Signed)
Pt's Belongings 1 grey tank top 1 grey long sleeve shirt 1 pair black spandex 1 pair white socks 1 pair black and pink tennis shoes 1 cream colored jacket 1 iphone white 1 white necklace with a white colored mens ring as well as white colored cross on it. 1 pain white earrings 1 solitary white horseshoe earring. 3 white rings with clear stone.

## 2015-03-08 NOTE — Progress Notes (Signed)
Disposition CSW completed patient referrals to the following inpatient psych facilities:  [facility]  [facility]  [facility]  CSW will continue to follow patient for placement needs.  Seward SpeckLeo Savva [facility]CSW,[facility]AS [facility]al [facility] Disposition CSW [phone]

## 2015-03-08 NOTE — ED Notes (Signed)
Poison control called and stated that they were closing the case out due to the vital signs and lab work.

## 2015-03-08 NOTE — ED Notes (Signed)
Patient belongings is in locker 31.
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
## 2015-03-08 NOTE — ED Notes (Signed)
[facility] Ala [facility]Ford [facility] There are currently no beds available at their facility, however placement elsewhere is being sought.

## 2015-03-08 NOTE — ED Notes (Signed)
Teleassessment is in process with Ford (TTS)
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
## 2015-03-08 NOTE — ED Notes (Signed)
Called [place] poison control to report the seven xanax 0.5mg  with etoh, they recommend: Ekg Basic labs add acetaminophen and ASA level Pt might remain asymptomatic but monitor for respiratory depression. Hospitalization/admission recommended only if pt becomes symptomatic.

## 2015-03-08 NOTE — ED Notes (Signed)
Pt will be placed in a room in plain sight until a sitter can be obtained. Consulting civil engineerCharge RN and EDP [facility] as well as pt's wavering SI
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
## 2015-03-08 NOTE — ED Notes (Signed)
Patient's husband, Geronimo Runningodd Shaffer, called and wanted us to know that patient's father and sister are both bipolar.  Mr. Chalmers GuestButner states he and patient's mother believe patient may be bipolar as well.  Mr. Chalmers GuestButner states he may be reached at [phone].

## 2015-03-08 NOTE — ED Provider Notes (Signed)
Pt here in SAPU under IVC.  C/o laceration to the left thumb that occurred > 12 hours ago.  TDAP UTD (<5 years).    Face to face Exam:   General: Awake  HEENT: Atraumatic  Resp: Normal effort  Abd: Nondistended  Neuro:strong grip strength in the left hand and 5/5 strength in the left thumb and pointer finger MSK: FROM of all fingers of the left hand Skin: 1.5 superficial, gaping laceration to the webspace of the left hand between the thumb and pointer finger.   LACERATION REPAIR Performed by: Dierdre ForthMuthersbaugh, Lyvia Mondesir Authorized by: Dierdre ForthMuthersbaugh, Dalvin Clipper Consent: Verbal consent obtained. Risks and benefits: risks, benefits and alternatives were discussed Consent given by: patient Patient identity confirmed: provided demographic data Prepped and Draped in normal sterile fashion Wound explored  Laceration Location: left hand  Laceration Length: 1.5 cm  No Foreign Bodies seen or palpated  Anesthesia: local infiltration  Local anesthetic: none  Irrigation method: syringe  Amount of cleaning: standard  Skin closure: steri-strips  Patient tolerance: Patient tolerated the procedure well with no immediate complications.   Pt also placed in thumb spica to prevent continued opening of the wound.    Dahlia ClientHannah Dezaray Shibuya, PA-C 03/08/15 1256  Nelva Nayobert Beaton, MD 03/18/15 [phone]

## 2015-03-08 NOTE — ED Notes (Signed)
Patient concerned that she is here and is angry that her husband took IVC paperwork on her.  Patient denies being suicidal and states she only wrote the note and threatened to hurt herself to get her husband's attention.  Patient states her husband has been cheating on her for "months," and they have been in counseling.  Her husband left her 2 days ago with no communication and patient is hurt and angry about the situation.

## 2015-03-09 NOTE — ED Notes (Signed)
[facility] officer arrived to transport pt to [facility].

## 2015-03-09 NOTE — ED Provider Notes (Signed)
[facility]staff indicate patient accepted [facility], Dr Wendy PoetJack Wang accepting physician.  Pt alert, content, nad. Vitals normal.   Pt currently appears stable for transfer.       Cathren LaineKevin Tayvin Preslar, MD 03/09/15 [phone]

## 2015-03-09 NOTE — ED Notes (Signed)
Resting quietly with eye closed. Easily arousable. Verbally responsive. Resp even and unlabored. ABC's intact. No behavior problems noted. NAD noted. Sitter at bedside.  

## 2015-03-09 NOTE — Discharge Instructions (Signed)
Transfer to [facility] 

## 2015-03-09 NOTE — ED Notes (Signed)
Received call and pt was accepted to [facility] with admitting MD, Wendy PoetJack Wang.

## 2015-03-09 NOTE — ED Notes (Signed)
Resting quietly with eye closed. Easily arousable. Verbally responsive. Resp even and unlabored. ABC's intact. No behavior problems noted. NAD noted. Sitter at bedside. [facility] called and reported that they will be here in for transport.

## 2015-03-09 NOTE — ED Notes (Addendum)
Pt informed of [facility] and voiced concerns of not being able to see family and became upset and crying. Pt eating breakfast tray.

## 2015-03-09 NOTE — ED Notes (Signed)
Awake. Verbally responsive. A/O x4. Resp even and unlabored. No audible adventitious breath sounds noted. ABC's intact.  

## 2015-03-23 ENCOUNTER — Encounter: Payer: Self-pay | Admitting: Obstetrics and Gynecology

## 2015-03-23 ENCOUNTER — Ambulatory Visit (INDEPENDENT_AMBULATORY_CARE_PROVIDER_SITE_OTHER): Payer: Self-pay | Admitting: Obstetrics and Gynecology

## 2015-03-23 VITALS — BP 120/82 | HR 80 | Ht 65.0 in | Wt 151.0 lb

## 2015-03-23 DIAGNOSIS — R300 Dysuria: Secondary | ICD-10-CM

## 2015-03-23 DIAGNOSIS — R319 Hematuria, unspecified: Secondary | ICD-10-CM

## 2015-03-23 DIAGNOSIS — R350 Frequency of micturition: Secondary | ICD-10-CM

## 2015-03-23 LAB — POCT URINALYSIS DIPSTICK
BILIRUBIN UA: NEGATIVE
GLUCOSE UA: NEGATIVE
KETONES UA: NEGATIVE
Nitrite, UA: POSITIVE
Protein, UA: NEGATIVE
Urobilinogen, UA: NEGATIVE
pH, UA: 5

## 2015-03-23 MED ORDER — SULFAMETHOXAZOLE-TRIMETHOPRIM 800-160 MG PO TABS: ORAL_TABLET | ORAL | Status: DC

## 2015-03-23 NOTE — Progress Notes (Signed)
Patient ID: Kaitlyn Carter, female   DOB: [birth year], 50 y.o.   MRN: [id number] GYNECOLOGY  VISIT   HPI: 50 y.o.   Married  Caucasian  female   G3P0 with No LMP recorded. Patient has had a hysterectomy.   here for urinary frequency/urgency and complains of dysuria.  Patient thinks she has a urinary tract infection.  Denies hematuria.  No fevers, nausea, or vomiting.  Having bladder spasms.  Foul smelling urine.   Had UTI in November and was treated with November with Bactrim for one week.  A 3 day tx was not sufficient.   Husband reappearing in patient's life.  They have separated.  Had sexual activity with him and then developed these UTI symptoms.  She states she is on an emotional roller coaster.  He has multiple partners. 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  All other STD testing negative.  Urine Dip: 2+WBCs, 2+RBCs and Pos Nitrites  GYNECOLOGIC HISTORY: No LMP recorded. Patient has had a hysterectomy. Contraception:Hysterectomy Menopausal hormone therapy: none Last mammogram: 6-14 Implants/density cat.3/Neg/BiRads:[facility] Last pap smear: 2012 Neg        OB History    Gravida Para Term Preterm AB TAB SAB Ectopic Multiple Living   3         3         Patient Active Problem List   Diagnosis Date Noted  . Severe recurrent major depression without psychotic features (HCC) 03/08/2015  . Anxiety 03/08/2015  . Generalized anxiety disorder 03/08/2015  . OSA (obstructive sleep apnea) 02/16/2015  . Excessive daytime sleepiness 01/13/2015  . ADD (attention deficit disorder) 08/29/2012  . Snoring 07/19/2012  . Hypercalcemia 07/19/2012  . 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 Adjustment disorder with mixed anxiety and depressed mood 07/03/2012  . Urinary incontinence 07/03/2012  . Asthmatic bronchitis 07/03/2012  . Allergic rhinitis 07/03/2012  . S/P hysterectomy 07/03/2012    Past  Medical History  Diagnosis Date  . Attention deficit disorder   . Anxiety   . Dyspareunia   . Urinary incontinence   . PONV (postoperative nausea and vomiting)   . Seasonal allergies   . SVD (spontaneous vaginal delivery)     x 2  . Dysrhythmia     currently being seen by Dr Sanjuana Kava  [phone]  . Headache(784.0)     due to neck - otc meds prn  . Arthritis     neck  . Herpes simplex type 1 infection     Past Surgical History  Procedure Laterality Date  . Incontinence surgery  03/2002    [facility] sling--Dr. Di Kindle in  [facility]  . Abdominal hysterectomy  2001    [facility]-Dr. Miguel Aschoff  . Breast surgery    . Augmentation mammaplasty  2002  . Cervical fusion      c3-4  . Anterior cervical decompression/discectomy fusion 4 level/hardware removal N/A 11/16/2012    Procedure: Cervical Three-Four, Cervical Four-Five, Cervical Six-Seven, Cervical Seven-Thoracic One Anterior cervical decompression/diskectomy/fusion/possible removal of plate at J4-7;  Surgeon: Karn Cassis, MD;  Location: [facility];  Service: Neurosurgery;  Laterality: N/A;  ANTERIOR CERVICAL DECOMPRESSION/DISCECTOMY FUSION 4 LEVEL/HARDWARE REMOVAL  . Wisdom tooth extraction    . Pubovaginal sling N/A 01/29/2013    Procedure: REMOVAL OF SLING ; INSERTION OF XEROFORM GRAFT;  Surgeon: Martina Sinner, MD;  Location: [facility];  Service: Urology;  Laterality: N/A;  .  Cystoscopy N/A 01/29/2013    Procedure: CYSTOSCOPY;  Surgeon: Martina Sinner, MD;  Location: [facility];  Service: Urology;  Laterality: N/A;    Current Outpatient Prescriptions  Medication Sig Dispense Refill  . ALPRAZolam (XANAX) 0.5 MG tablet Take 0.5 mg by mouth at bedtime as needed for anxiety.    Marland Kitchen amphetamine-dextroamphetamine (ADDERALL XR) 30 MG 24 hr capsule Take 1 capsule by mouth daily.  0  . fluticasone (FLONASE) 50 MCG/ACT nasal spray Place 2 sprays into both nostrils daily. 16 g 1  . montelukast (SINGULAIR) 10 MG tablet Take 1 tablet  (10 mg total) by mouth at bedtime. 30 tablet 3  . venlafaxine XR (EFFEXOR-XR) 75 MG 24 hr capsule Take 3 capsules by mouth daily.  12   No current facility-administered medications for this visit.     ALLERGIES: Adhesive and Latex  Family History  Problem Relation Age of Onset  . Depression Mother   . Asthma Mother   . Diabetes Father   . Hypertension Father   . Heart disease Father   . Kidney disease Father   . Breast cancer Maternal Grandmother   . Hypertension Maternal Grandmother   . Hypertension Maternal Grandfather   . Hypertension Paternal Grandfather   . Stroke Paternal Grandfather   . Heart failure Father   . CAD Paternal Uncle     Social History   Social History  . Marital Status: Married    Spouse Name: N/A  . Number of Children: N/A  . Years of Education: N/A   Occupational History  . Not on file.   Social History Main Topics  . Smoking status: Never Smoker   . Smokeless tobacco: Never Used  . Alcohol Use: 0.0 oz/week    0 Standard drinks or equivalent per week     Comment: socially  . Drug Use: No  . Sexual Activity:    Partners: Male    Birth Control/ Protection: Surgical     Comment: [facility] 2001   Other Topics Concern  . Not on file   Social History Narrative    ROS:  Pertinent items are noted in HPI.  PHYSICAL EXAMINATION:    BP 120/82 mmHg  Pulse 80  Ht  (1.651 m)  Wt 151 lb (68.493 kg)  BMI 25.13 kg/m2    General appearance: alert, cooperative and appears stated age   ASSESSMENT  UTI.  PLAN  Discussion of UTI.  Urine micro and culture.  Bactrim DS po bid for 7 days.  AZO standard, 2 tabs, po tid prn for 2 days.  Hydrate well.  Call for increasing pain, nausea, vomiting, fever.  Patient declines STD testing today.  I discussed condom use with sexual activity.  Follow up for annual exam and prn.     An After Visit Summary was printed and given to the patient.  __15____ minutes face to face time of which over 50% was  spent in counseling.

## 2015-03-24 LAB — URINALYSIS, MICROSCOPIC ONLY
Casts: NONE SEEN [LPF]
Crystals: NONE SEEN [HPF]
Yeast: NONE SEEN [HPF]

## 2015-03-25 LAB — URINE CULTURE

## 2015-03-26 ENCOUNTER — Encounter: Payer: Self-pay | Admitting: Adult Health

## 2015-03-26 ENCOUNTER — Telehealth: Payer: Self-pay

## 2015-03-26 NOTE — Telephone Encounter (Signed)
Spoke with patient and notified of urine culture results.

## 2015-03-26 NOTE — Telephone Encounter (Signed)
-----   Message from Patton Salles, MD sent at 03/25/2015  8:16 PM EST ----- Results to patient through My Chart.

## 2015-03-26 NOTE — Telephone Encounter (Signed)
Called patient at [phone] to discuss Urine culture results since she hasn't reviewed her MyChart message, left message on voicemail to call me.

## 2015-03-30 ENCOUNTER — Ambulatory Visit (INDEPENDENT_AMBULATORY_CARE_PROVIDER_SITE_OTHER): Payer: Self-pay | Admitting: Adult Health

## 2015-03-30 ENCOUNTER — Encounter: Payer: Self-pay | Admitting: Adult Health

## 2015-03-30 VITALS — BP 100/78 | HR 103 | Temp 98.5°F | Ht 66.0 in | Wt 148.0 lb

## 2015-03-30 DIAGNOSIS — G4733 Obstructive sleep apnea (adult) (pediatric): Secondary | ICD-10-CM

## 2015-03-30 NOTE — Patient Instructions (Signed)
Continue on CPAP At bedtime  .  Goal is to wear for at least 4-6 hrs each night .  Do not drive if sleepy.  Call if mask/head gear/APP not working out.  Follow up  In 3-4 months and As needed

## 2015-03-30 NOTE — Addendum Note (Signed)
Addended by: Karalee Height on: 03/30/2015 10:52 AM   Modules accepted: Orders

## 2015-03-30 NOTE — Progress Notes (Signed)
Subjective:    Patient ID: Kaitlyn Carter, female    DOB: 04-17-1965, 50 y.o.   MRN: [id number]  HPI    Review of Systems     Objective:   Physical Exam        Assessment & Plan:     Subjective:    Patient ID: Kaitlyn Carter, female    DOB: [birth year], 50 y.o.   MRN: [id number]  HPI 50 year-old woman referred for evaluation of  For daytime sleepiness and snoring in  07/2012  With Dr. Vassie Loll  . Hx of cervical spine dz s/p surgery   TEST  HST on 01/13/15 that showed AHI 13.4/hr , she did have mixed events with +centrals evident. SaO2 low at 82%.   03/30/2015 Follow up : OSA  Pt returns for 1 month follow up for sleep issues.  She was recently found to have mild OSA and started on CPAP .  Says she is trying to wear CPAP every night . Working with [facility] on mask. Has tried several different mask. Download shows okay usage ~67%, avg ~6 hr each night , AHI 2.0 . No sign central events noted (0.6)  She has been under enormous stress over last couple of months, husband of 13 yr has left her and family. She is seeing Psych for counseling, very upset having hard time sleeping. We discussed healthy sleep regimen. Advised may try melatonin . Her anxiety meds have recently been adjusted.  Support provided. Still feels tired but says she will keep trying to make CPAP work.  Pt does not have any insurance .    Past Medical History  Diagnosis Date  . Attention deficit disorder   . Anxiety   . Dyspareunia   . Urinary incontinence   . PONV (postoperative nausea and vomiting)   . Seasonal allergies   . SVD (spontaneous vaginal delivery)     x 2  . Dysrhythmia     currently being seen by Dr Sanjuana Kava  [phone]  . Headache(784.0)     due to neck - otc meds prn  . Arthritis     neck  . Herpes simplex type 1 infection    Current Outpatient Prescriptions on File Prior to Visit  Medication Sig Dispense Refill  . ALPRAZolam (XANAX) 0.5 MG tablet Take 0.5 mg by mouth 4 (four) times daily.      Marland Kitchen amphetamine-dextroamphetamine (ADDERALL XR) 30 MG 24 hr capsule Take 1 capsule by mouth daily.  0  . fluticasone (FLONASE) 50 MCG/ACT nasal spray Place 2 sprays into both nostrils daily. 16 g 1  . montelukast (SINGULAIR) 10 MG tablet Take 1 tablet (10 mg total) by mouth at bedtime. 30 tablet 3  . sulfamethoxazole-trimethoprim (BACTRIM DS) 800-160 MG tablet Take one tablet by mouth twice a day for 7 days. 14 tablet 0  . venlafaxine XR (EFFEXOR-XR) 75 MG 24 hr capsule Take 3 capsules by mouth daily.  12   No current facility-administered medications on file prior to visit.     Review of Systems Constitutional:   No  weight loss, night sweats,  Fevers, chills + fatigue, or  lassitude.  HEENT:   No headaches,  Difficulty swallowing,  Tooth/dental problems, or  Sore throat,                No sneezing, itching, ear ache, nasal congestion, post nasal drip,   CV:  No chest pain,  Orthopnea, PND, swelling in lower extremities, anasarca, dizziness, palpitations, syncope.  GI  No heartburn, indigestion, abdominal pain, nausea, vomiting, diarrhea, change in bowel habits, loss of appetite, bloody stools.   Resp: No shortness of breath with exertion or at rest.  No excess mucus, no productive cough,  No non-productive cough,  No coughing up of blood.  No change in color of mucus.  No wheezing.  No chest wall deformity  Skin: no rash or lesions.  GU: no dysuria, change in color of urine, no urgency or frequency.  No flank pain, no hematuria   MS:  No joint pain or swelling.  No decreased range of motion.  No back pain.  Psych:  No change in mood or affect. No depression or anxiety.  No memory loss.         Objective:   Physical Exam Filed Vitals:   03/30/15 0859  BP: 100/78  Pulse: 103  Temp: 98.5 F (36.9 C)  TempSrc: Oral  Height:  (1.676 m)  Weight: 148 lb (67.132 kg)  SpO2: 100%   Body mass index is 23.9 kg/(m^2).   GEN: A/Ox3; pleasant , NAD  HEENT:  [place]/AT,   EACs-clear, TMs-wnl, NOSE-clear, THROAT-clear, no lesions, no postnasal drip or exudate noted. Class 2 MP airway , elongated uvula   NECK:  Supple w/ fair ROM; no JVD; normal carotid impulses w/o bruits; no thyromegaly or nodules palpated; no lymphadenopathy.  RESP  Clear  P & A; w/o, wheezes/ rales/ or rhonchi.no accessory muscle use, no dullness to percussion  CARD:  RRR, no m/r/g  , no peripheral edema, pulses intact, no cyanosis or clubbing.  GI:   Soft & nt; nml bowel sounds; no organomegaly or masses detected.  Musco: Warm bil, no deformities or joint swelling noted.   Neuro: alert, no focal deficits noted.    Skin: Warm, no lesions or rashes        Assessment & Plan:

## 2015-03-30 NOTE — Assessment & Plan Note (Signed)
Encouraged on CPAP compliance  Stress reducers /cont w/ counseling to help with anxiety that affecting sleep regimen   Plan  Continue on CPAP At bedtime  .  Goal is to wear for at least 4-6 hrs each night .  Do not drive if sleepy.  Call if mask/head gear/APP not working out.  Follow up  In 3-4 months and As needed

## 2015-03-30 NOTE — Progress Notes (Signed)
Reviewed & agree with plan  

## 2015-04-17 ENCOUNTER — Encounter: Payer: Self-pay | Admitting: Adult Health

## 2015-05-01 ENCOUNTER — Encounter: Payer: Self-pay | Admitting: Obstetrics and Gynecology

## 2015-05-01 ENCOUNTER — Ambulatory Visit (INDEPENDENT_AMBULATORY_CARE_PROVIDER_SITE_OTHER): Payer: Self-pay | Admitting: Obstetrics and Gynecology

## 2015-05-01 VITALS — BP 138/84 | HR 90 | Ht 65.0 in | Wt 145.4 lb

## 2015-05-01 DIAGNOSIS — N9089 Other specified noninflammatory disorders of vulva and perineum: Secondary | ICD-10-CM

## 2015-05-01 DIAGNOSIS — Z113 Encounter for screening for infections with a predominantly sexual mode of transmission: Secondary | ICD-10-CM

## 2015-05-01 DIAGNOSIS — N760 Acute vaginitis: Secondary | ICD-10-CM

## 2015-05-01 NOTE — Progress Notes (Signed)
GYNECOLOGY  VISIT   HPI: 50 y.o.   Married  Caucasian  female   G3P0 with No LMP recorded. Patient has had a hysterectomy.   here for vaginal itching, white milky discharge, and a sore on the left labia.    Did OTC of Monistat tx for 2 nights.  Felt better and now feeling sore again.  No odor or burning.   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 Serum HSV testing confirms HSV I and not HSV II.  Patient believes she tested positive for HSV II in past.   Had a new partner who performed oral intercourse.  Wants full STD check today.   GYNECOLOGIC HISTORY: No LMP recorded. Patient has had a hysterectomy. Contraception: None Last mammogram: 08/02/12 Category 3 Bi-Rads 2 Benign  Last pap smear: Unknown        OB History    Gravida Para Term Preterm AB TAB SAB Ectopic Multiple Living   3         3         Patient Active Problem List   Diagnosis Date Noted  . Severe recurrent major depression without psychotic features (HCC) 03/08/2015  . Anxiety 03/08/2015  . Generalized anxiety disorder 03/08/2015  . OSA (obstructive sleep apnea) 02/16/2015  . Excessive daytime sleepiness 01/13/2015  . ADD (attention deficit disorder) 08/29/2012  . Snoring 07/19/2012  . Hypercalcemia 07/19/2012  . 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 Adjustment disorder with mixed anxiety and depressed mood 07/03/2012  . Urinary incontinence 07/03/2012  . Asthmatic bronchitis 07/03/2012  . Allergic rhinitis 07/03/2012  . S/P hysterectomy 07/03/2012    Past Medical History  Diagnosis Date  . Attention deficit disorder   . Anxiety   . Dyspareunia   . Urinary incontinence   . PONV (postoperative nausea and vomiting)   . Seasonal allergies   . SVD (spontaneous vaginal delivery)     x 2  . Dysrhythmia     currently being seen by Dr Sanjuana KavaMcAlhaney  [phone]  . Headache(784.0)     due to neck - otc meds prn  . Arthritis     neck  . Herpes simplex type 1 infection     Past Surgical History  Procedure Laterality Date   . Incontinence surgery  03/2002    [facility] Kindle[facility]ley [facility] [facility]  . Abdominal hysterectomy  2001    [facility]-Dr. Miguel AschoffAllan Ross  . Breast surgery    . Augmentation mammaplasty  2002  . Cervical fusion      c3-4  . Anterior cervical decompression/discectomy fusion 4 level/hardware removal N/A 11/16/2012    Procedure: Cervical Three-Four, Cervical Four-Five, Cervical Six-Seven, Cervical Seven-Thoracic One Anterior cervical decompression/diskectomy/fusion/possible removal of plate at A5-4C5-6;  Surgeon: Karn CassisErnesto M Botero, MD;  Location: [facility];  Service: Neurosurgery;  Laterality: N/A;  ANTERIOR CERVICAL DECOMPRESSION/DISCECTOMY FUSION 4 LEVEL/HARDWARE REMOVAL  . Wisdom tooth extraction    . Pubovaginal sling N/A 01/29/2013    Procedure: REMOVAL OF SLING ; INSERTION OF XEROFORM GRAFT;  Surgeon: Martina SinnerScott A MacDiarmid, MD;  Location: [facility];  Service: Urology;  Laterality: N/A;  . Cystoscopy N/A 01/29/2013    Procedure: CYSTOSCOPY;  Surgeon: Martina SinnerScott A MacDiarmid, MD;  Location: [facility];  Service: Urology;  Laterality: N/A;    Current Outpatient Prescriptions  Medication Sig Dispense Refill  . ALPRAZolam (XANAX) 0.5 MG tablet Take 0.5 mg by mouth 4 (four) times daily.     Marland Kitchen. amphetamine-dextroamphetamine (ADDERALL XR) 30 MG 24 hr capsule  Take 1 capsule by mouth daily.  0  . fluticasone (FLONASE) 50 MCG/ACT nasal spray Place 2 sprays into both nostrils daily. 16 g 1  . montelukast (SINGULAIR) 10 MG tablet Take 1 tablet (10 mg total) by mouth at bedtime. 30 tablet 3  . sulfamethoxazole-trimethoprim (BACTRIM DS) 800-160 MG tablet Take one tablet by mouth twice a day for 7 days. 14 tablet 0  . venlafaxine XR (EFFEXOR-XR) 75 MG 24 hr capsule Take 3 capsules by mouth daily.  12   No current facility-administered medications for this visit.     ALLERGIES: Adhesive and Latex  Family History  Problem Relation Age of Onset  . Depression Mother   . Asthma Mother   . Diabetes Father   .  Hypertension Father   . Heart disease Father   . Kidney disease Father   . Breast cancer Maternal Grandmother   . Hypertension Maternal Grandmother   . Hypertension Maternal Grandfather   . Hypertension Paternal Grandfather   . Stroke Paternal Grandfather   . Heart failure Father   . CAD Paternal Uncle     Social History   Social History  . Marital Status: Married    Spouse Name: N/A  . Number of Children: N/A  . Years of Education: N/A   Occupational History  . Not on file.   Social History Main Topics  . Smoking status: Never Smoker   . Smokeless tobacco: Never Used  . Alcohol Use: 0.0 oz/week    0 Standard drinks or equivalent per week     Comment: socially  . Drug Use: No  . Sexual Activity:    Partners: Male    Birth Control/ Protection: Surgical     Comment: [facility] 2001   Other Topics Concern  . Not on file   Social History Narrative    ROS:  Pertinent items are noted in HPI.  PHYSICAL EXAMINATION:    BP 138/84 mmHg  Pulse 90  Ht  (1.651 m)  Wt 145 lb 6.4 oz (65.953 kg)  BMI 24.20 kg/m2    General appearance: alert, cooperative and appears stated age    Pelvic: External genitalia:  Small  3 mm tender excoriation of the tip of left labia minora.              Urethra:  normal appearing urethra with no masses, tenderness or lesions              Bartholins and Skenes: normal                 Vagina: normal appearing vagina with normal color and discharge, no lesions              Cervix: absent              Bimanual Exam:  Uterus:  uterus absent              Adnexa: normal adnexa and no mass, fullness, tenderness                 Chaperone was present for exam.  ASSESSMENT   Labial lesion.  Possible HSV.  Desire for STD testing.  Vaginitis.   PLAN  Counseled regarding labial lesion and possible HSV.  Patient declines antiviral tx and Xylocaine. HSV culture done.  GC/CT, HIV, RPR, hep B and C, Affirm.  Treatment to follow after test results  back.  Discussed female condom use.  Follow up prn.   An After Visit Summary  was printed and given to the patient.  _15_____ minutes face to face time of which over 50% was spent in counseling.

## 2015-05-02 LAB — GC/CHLAMYDIA PROBE AMP
CT Probe RNA: NOT DETECTED
GC PROBE AMP APTIMA: NOT DETECTED

## 2015-05-02 LAB — WET PREP BY MOLECULAR PROBE
CANDIDA SPECIES: NEGATIVE
GARDNERELLA VAGINALIS: NEGATIVE
TRICHOMONAS VAG: NEGATIVE

## 2015-05-02 LAB — STD PANEL
HIV 1&2 Ab, 4th Generation: NONREACTIVE
Hepatitis B Surface Ag: NEGATIVE

## 2015-05-02 LAB — HEPATITIS C ANTIBODY: HCV AB: NEGATIVE

## 2015-05-04 LAB — HERPES SIMPLEX VIRUS CULTURE: Organism ID, Bacteria: NOT DETECTED

## 2015-05-05 ENCOUNTER — Telehealth: Payer: Self-pay | Admitting: Emergency Medicine

## 2015-05-05 NOTE — Telephone Encounter (Signed)
Call to patient. No voicemail available.

## 2015-05-05 NOTE — Telephone Encounter (Signed)
-----   Message from Patton SallesBrook E Amundson C Silva, MD sent at 05/05/2015  7:12 AM EST ----- Please let the patient know that her herpes culture was negative.  I think she just had a vulvar abrasion.  Cc- Claudette LawsAmanda Dixon

## 2015-05-07 NOTE — Telephone Encounter (Signed)
Spoke with patient and notified herpes culture negative.

## 2015-06-17 ENCOUNTER — Ambulatory Visit: Payer: Self-pay | Admitting: Pulmonary Disease

## 2015-07-28 ENCOUNTER — Ambulatory Visit (INDEPENDENT_AMBULATORY_CARE_PROVIDER_SITE_OTHER): Payer: Self-pay | Admitting: Adult Health

## 2015-07-28 ENCOUNTER — Encounter: Payer: Self-pay | Admitting: Adult Health

## 2015-07-28 ENCOUNTER — Other Ambulatory Visit (INDEPENDENT_AMBULATORY_CARE_PROVIDER_SITE_OTHER): Payer: Self-pay

## 2015-07-28 VITALS — BP 112/82 | HR 93 | Temp 98.1°F | Ht 65.0 in | Wt 145.0 lb

## 2015-07-28 DIAGNOSIS — G4733 Obstructive sleep apnea (adult) (pediatric): Secondary | ICD-10-CM

## 2015-07-28 DIAGNOSIS — R300 Dysuria: Secondary | ICD-10-CM

## 2015-07-28 LAB — URINALYSIS, ROUTINE W REFLEX MICROSCOPIC
Bilirubin Urine: NEGATIVE
Ketones, ur: NEGATIVE
Leukocytes, UA: NEGATIVE
Nitrite: NEGATIVE
PH: 6 (ref 5.0–8.0)
TOTAL PROTEIN, URINE-UPE24: NEGATIVE
URINE GLUCOSE: NEGATIVE
UROBILINOGEN UA: 0.2 (ref 0.0–1.0)

## 2015-07-28 NOTE — Patient Instructions (Addendum)
Continue on CPAP At bedtime  .  Order for nasal mask sent to DME .  Goal is to wear for at least 4-6 hrs each night .  Do not drive if sleepy.  Use saline nasal rinses and nasal gel As needed   Follow up  In 6  months and As needed   I will call with urine results.

## 2015-07-28 NOTE — Progress Notes (Signed)
Subjective:    Patient ID: Kaitlyn Carter, female    DOB: 08-09-1965, 50 y.o.   MRN: [id number]  HPI  50 year-old woman referred for evaluation of For daytime sleepiness and snoring in 07/2012 With Dr. Vassie LollAlva Hx of cervical spine dz s/p surgery   TEST  HST on 01/13/15 that showed AHI 13.4/hr , she did have mixed events with +centrals evident. SaO2 low at 82%.   07/28/2015 Follow up : OSA  Pt returns for 4 month follow up for sleep issues.  She has mild OSA on CPAP .  Says she has not been wearing her mask, as she pulls off during the night . Feels the nasal pillows do not work for her. Wants to try a different mask.  Continues to have a lot of stress , husband of 13 yr has left her and family. She is seeing Psych for counseling, very upset having hard time sleeping . Pt does not have any insurance .   She does complain of 2 days of dysuria and urgency .feels she has a UTI. Wants to have her urine checked today. No back pain, fever, hematuria, discharge , n/v, or recent abx use.    Past Medical History  Diagnosis Date  . Attention deficit disorder   . Anxiety   . Dyspareunia   . Urinary incontinence   . PONV (postoperative nausea and vomiting)   . Seasonal allergies   . SVD (spontaneous vaginal delivery)     x 2  . Dysrhythmia     currently being seen by Dr Sanjuana KavaMcAlhaney  [phone]  . Headache(784.0)     due to neck - otc meds prn  . Arthritis     neck  . Herpes simplex type 1 infection    '. Current Outpatient Prescriptions on File Prior to Visit  Medication Sig Dispense Refill  . ALPRAZolam (XANAX) 0.5 MG tablet Take 0.5 mg by mouth 4 (four) times daily.     Marland Kitchen. amphetamine-dextroamphetamine (ADDERALL XR) 30 MG 24 hr capsule Take 1 capsule by mouth daily.  0  . venlafaxine XR (EFFEXOR-XR) 75 MG 24 hr capsule Take 3 capsules by mouth daily.  12  . fluticasone (FLONASE) 50 MCG/ACT nasal spray Place 2 sprays into both nostrils daily. (Patient not taking: Reported on 07/28/2015) 16  g 1  . montelukast (SINGULAIR) 10 MG tablet Take 1 tablet (10 mg total) by mouth at bedtime. (Patient not taking: Reported on 07/28/2015) 30 tablet 3   No current facility-administered medications on file prior to visit.      Review of Systems Constitutional:   No  weight loss, night sweats,  Fevers, chills, fatigue, or  lassitude.  HEENT:   No headaches,  Difficulty swallowing,  Tooth/dental problems, or  Sore throat,                No sneezing, itching, ear ache, nasal congestion, post nasal drip,   CV:  No chest pain,  Orthopnea, PND, swelling in lower extremities, anasarca, dizziness, palpitations, syncope.   GI  No heartburn, indigestion, abdominal pain, nausea, vomiting, diarrhea, change in bowel habits, loss of appetite, bloody stools.   Resp: No shortness of breath with exertion or at rest.  No excess mucus, no productive cough,  No non-productive cough,  No coughing up of blood.  No change in color of mucus.  No wheezing.  No chest wall deformity  Skin: no rash or lesions.  GU: no dysuria, change in color of urine, no  urgency or frequency.  No flank pain, no hematuria   MS:  No joint pain or swelling.  No decreased range of motion.  No back pain.  Psych:  No change in mood or affect. No depression or anxiety.  No memory loss.         Objective:   Physical Exam  Filed Vitals:   07/28/15 0854  BP: 112/82  Pulse: 93  Temp: 98.1 F (36.7 C)  TempSrc: Oral  Height:  (1.651 m)  Weight: 145 lb (65.772 kg)  SpO2: 99%   GEN: A/Ox3; pleasant , NAD, well nourished   HEENT:  [place]/AT,  EACs-clear, TMs-wnl, NOSE-clear, THROAT-clear, no lesions, no postnasal drip or exudate noted. Class 1 MP airway   NECK:  Supple w/ fair ROM; no JVD; normal carotid impulses w/o bruits; no thyromegaly or nodules palpated; no lymphadenopathy.  RESP  Clear  P & A; w/o, wheezes/ rales/ or rhonchi.no accessory muscle use, no dullness to percussion  CARD:  RRR, no m/r/g  , no peripheral  edema, pulses intact, no cyanosis or clubbing.  GI:   Soft & nt; nml bowel sounds; no organomegaly or masses detected.  Musco: Warm bil, no deformities or joint swelling noted.   Neuro: alert, no focal deficits noted.    Skin: Warm, no lesions or rashes  Tammy Parrett NP-C  [facility]  07/28/2015       Assessment & Plan:

## 2015-07-28 NOTE — Assessment & Plan Note (Signed)
Check UA/Cx 

## 2015-07-28 NOTE — Assessment & Plan Note (Signed)
Mild OSA -having difficulty with mask.  New mask order for nasal mask.   Plan  Continue on CPAP At bedtime  .  Order for nasal mask sent to DME .  Goal is to wear for at least 4-6 hrs each night .  Do not drive if sleepy.  Call if mask/head gear/APP not working out.  Follow up  In 6  months and As needed

## 2015-07-29 LAB — URINE CULTURE
Colony Count: NO GROWTH
ORGANISM ID, BACTERIA: NO GROWTH

## 2015-07-29 NOTE — Progress Notes (Signed)
Quick Note:  LVM for pt to return call ______ 

## 2015-07-30 NOTE — Progress Notes (Signed)
Quick Note:  Called spoke with pt. Reviewed results and recs. Pt voiced understanding and had no further questions. ______ 

## 2015-08-07 NOTE — Progress Notes (Signed)
Reviewed & agree with plan  

## 2015-11-23 ENCOUNTER — Ambulatory Visit (INDEPENDENT_AMBULATORY_CARE_PROVIDER_SITE_OTHER): Payer: Self-pay | Admitting: Obstetrics and Gynecology

## 2015-11-23 ENCOUNTER — Encounter: Payer: Self-pay | Admitting: Obstetrics and Gynecology

## 2015-11-23 VITALS — BP 130/80 | HR 84 | Resp 14 | Wt 169.0 lb

## 2015-11-23 DIAGNOSIS — R350 Frequency of micturition: Secondary | ICD-10-CM

## 2015-11-23 DIAGNOSIS — R300 Dysuria: Secondary | ICD-10-CM

## 2015-11-23 LAB — POCT URINALYSIS DIPSTICK
BILIRUBIN UA: NEGATIVE
Blood, UA: NEGATIVE
GLUCOSE UA: NEGATIVE
KETONES UA: NEGATIVE
Nitrite, UA: NEGATIVE
PH UA: 6.5
Protein, UA: NEGATIVE
Urobilinogen, UA: NEGATIVE

## 2015-11-23 MED ORDER — NITROFURANTOIN MACROCRYSTAL 50 MG PO CAPS: ORAL_CAPSULE | ORAL | 2 refills | Status: DC

## 2015-11-23 MED ORDER — SULFAMETHOXAZOLE-TRIMETHOPRIM 800-160 MG PO TABS: 1.0000 | ORAL_TABLET | Freq: Two times a day (BID) | ORAL | 0 refills | Status: DC

## 2015-11-23 NOTE — Patient Instructions (Signed)

## 2015-11-23 NOTE — Progress Notes (Signed)
GYNECOLOGY  VISIT   HPI: 50 y.o.   Separated Caucasian  female   G3P0 with No LMP recorded. Patient has had a hysterectomy.   here for  Possible UTI. On Saturday she was having urinary urgency and frequency to urinate with dysuria. She started Azo which helped.  Separated x 8 months. She has a new partner, voids after intercourse. She has been treated for a UTI for the last 2 x in the last 2 months (urgent care). No fevers, no flank pain.  In the past she used antibiotic prophylaxis with intercourse to prevent UTI's.   GYNECOLOGIC HISTORY: No LMP recorded. Patient has had a hysterectomy. Contraception:hysterectomy  Menopausal hormone therapy: None         OB History    Gravida Para Term Preterm AB Living   3         3   SAB TAB Ectopic Multiple Live Births                     Patient Active Problem List   Diagnosis Date Noted  . Dysuria 07/28/2015  . Severe recurrent major depression without psychotic features (HCC) 03/08/2015  . Anxiety 03/08/2015  . Generalized anxiety disorder 03/08/2015  . OSA (obstructive sleep apnea) 02/16/2015  . Excessive daytime sleepiness 01/13/2015  . ADD (attention deficit disorder) 08/29/2012  . Snoring 07/19/2012  . Hypercalcemia 07/19/2012  . 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 Adjustment disorder with mixed anxiety and depressed mood 07/03/2012  . Urinary incontinence 07/03/2012  . Asthmatic bronchitis 07/03/2012  . Allergic rhinitis 07/03/2012  . S/P hysterectomy 07/03/2012    Past Medical History:  Diagnosis Date  . Anxiety   . Arthritis    neck  . Attention deficit disorder   . Dyspareunia   . Dysrhythmia    currently being seen by Dr Sanjuana Kava  [phone]  . Headache(784.0)    due to neck - otc meds prn  . Herpes simplex type 1 infection   . PONV (postoperative nausea and vomiting)   . Seasonal allergies   . SVD (spontaneous vaginal delivery)    x 2  . Urinary incontinence     Past Surgical History:  Procedure  Laterality Date  . ABDOMINAL HYSTERECTOMY  2001   [facility]-Dr. Miguel Aschoff  . ANTERIOR CERVICAL DECOMPRESSION/DISCECTOMY FUSION 4 LEVEL/HARDWARE REMOVAL N/A 11/16/2012   Procedure: Cervical Three-Four, Cervical Four-Five, Cervical Six-Seven, Cervical Seven-Thoracic One Anterior cervical decompression/diskectomy/fusion/possible removal of plate at A5-4;  Surgeon: Karn Cassis, MD;  Location: [facility];  Service: Neurosurgery;  Laterality: N/A;  ANTERIOR CERVICAL DECOMPRESSION/DISCECTOMY FUSION 4 LEVEL/HARDWARE REMOVAL  . AUGMENTATION MAMMAPLASTY  2002  . BREAST SURGERY    . CERVICAL FUSION     c3-4  . CYSTOSCOPY N/A 01/29/2013   Procedure: CYSTOSCOPY;  Surgeon: Martina Sinner, MD;  Location: [facility];  Service: Urology;  Laterality: N/A;  . INCONTINENCE SURGERY  03/2002   SPARC sling--Dr. Di Kindle in  [facility]  . PUBOVAGINAL SLING N/A 01/29/2013   Procedure: REMOVAL OF SLING ; INSERTION OF XEROFORM GRAFT;  Surgeon: Martina Sinner, MD;  Location: [facility];  Service: Urology;  Laterality: N/A;  . WISDOM TOOTH EXTRACTION      Current Outpatient Prescriptions  Medication Sig Dispense Refill  . amphetamine-dextroamphetamine (ADDERALL XR) 30 MG 24 hr capsule Take 1 capsule by mouth daily.  0  . fluticasone (FLONASE) 50 MCG/ACT nasal spray Place 2 sprays into both nostrils daily. 16 g 1  .  montelukast (SINGULAIR) 10 MG tablet Take 1 tablet (10 mg total) by mouth at bedtime. 30 tablet 3  . venlafaxine XR (EFFEXOR-XR) 75 MG 24 hr capsule Take 3 capsules by mouth daily.  12   No current facility-administered medications for this visit.      ALLERGIES: Adhesive [tape] and Latex  Family History  Problem Relation Age of Onset  . Depression Mother   . Asthma Mother   . Diabetes Father   . Hypertension Father   . Heart disease Father   . Kidney disease Father   . Breast cancer Maternal Grandmother   . Hypertension Maternal Grandmother   . Hypertension Maternal Grandfather   .  Hypertension Paternal Grandfather   . Stroke Paternal Grandfather   . Heart failure Father   . CAD Paternal Uncle     Social History   Social History  . Marital status: Married    Spouse name: N/A  . Number of children: N/A  . Years of education: N/A   Occupational History  . Not on file.   Social History Main Topics  . Smoking status: Never Smoker  . Smokeless tobacco: Never Used  . Alcohol use 0.0 oz/week     Comment: socially  . Drug use: No  . Sexual activity: Yes    Partners: Male    Birth control/ protection: Surgical     Comment: [facility] 2001   Other Topics Concern  . Not on file   Social History Narrative  . No narrative on file    Review of Systems  Constitutional: Negative.   HENT: Negative.   Eyes: Negative.   Respiratory: Negative.   Cardiovascular: Negative.   Gastrointestinal: Negative.   Genitourinary: Negative.   Musculoskeletal: Negative.   Skin: Negative.   Neurological: Negative.   Endo/Heme/Allergies: Negative.   Psychiatric/Behavioral: Negative.     PHYSICAL EXAMINATION:    BP 130/80 (BP Location: Right Arm, Patient Position: Sitting, Cuff Size: Normal)   Pulse 84   Resp 14   Wt 169 lb (76.7 kg)   BMI 28.12 kg/m     General appearance: alert, cooperative and appears stated age Abdomen: soft, mildly tender in the suprapubic region, no masses,  no organomegaly   ASSESSMENT UTI Recurrent UTI's with intercourse    PLAN Bactrim DS for UTI Macrodantin 50 mg po prn intercourse Urine culture sent Call if not feeling better in 2 days    An After Visit Summary was printed and given to the patient.

## 2015-11-26 LAB — URINE CULTURE

## 2016-01-29 ENCOUNTER — Ambulatory Visit (INDEPENDENT_AMBULATORY_CARE_PROVIDER_SITE_OTHER): Payer: Self-pay | Admitting: Adult Health

## 2016-01-29 ENCOUNTER — Encounter: Payer: Self-pay | Admitting: Adult Health

## 2016-01-29 DIAGNOSIS — G4733 Obstructive sleep apnea (adult) (pediatric): Secondary | ICD-10-CM

## 2016-01-29 NOTE — Assessment & Plan Note (Signed)
Doing well on CPAP   Plan  Patient Instructions  Continue on CPAP At bedtime  .  Order for nasal mask sent to DME .  Goal is to wear for at least 4-6 hrs each night .  Do not drive if sleepy.  Use saline nasal rinses and nasal gel As needed   Follow up  In 1 year and As needed

## 2016-01-29 NOTE — Patient Instructions (Signed)
Continue on CPAP At bedtime  .  Order for nasal mask sent to DME .  Goal is to wear for at least 4-6 hrs each night .  Do not drive if sleepy.  Use saline nasal rinses and nasal gel As needed   Follow up  In 1 year and As needed

## 2016-01-29 NOTE — Progress Notes (Signed)
Subjective:    Patient ID: Kaitlyn Carter, female    DOB: Oct 07, 1965, 50 y.o.   MRN: [id number]  HPI  50 year-old woman referred for evaluation of For daytime sleepiness and snoring in 07/2012 With Dr. Vassie LollAlva Hx of cervical spine dz s/p surgery   TEST  HST on 01/13/15 that showed AHI 13.4/hr , she did have mixed events with +centrals evident. SaO2 low at 82%.   01/29/2016 Follow up : OSA  Pt returns for 6 month follow up for sleep issues.  She has mild OSA on CPAP .  Says she is doing better, likes the nasal mask much better. Feels rested.  Download shows improved compliance with avg usage at 6hr . AHI 3.5.  Going on cruise over holiday with new boyfriend.    Past Medical History:  Diagnosis Date  . Anxiety   . Arthritis    neck  . Attention deficit disorder   . Dyspareunia   . Dysrhythmia    currently being seen by Dr Sanjuana KavaMcAlhaney  [phone]  . Headache(784.0)    due to neck - otc meds prn  . Herpes simplex type 1 infection   . PONV (postoperative nausea and vomiting)   . Seasonal allergies   . SVD (spontaneous vaginal delivery)    x 2  . Urinary incontinence    '. Current Outpatient Prescriptions on File Prior to Visit  Medication Sig Dispense Refill  . amphetamine-dextroamphetamine (ADDERALL XR) 30 MG 24 hr capsule Take 1 capsule by mouth daily.  0  . nitrofurantoin (MACRODANTIN) 50 MG capsule 1 tab po prn intercourse 30 capsule 2  . venlafaxine XR (EFFEXOR-XR) 75 MG 24 hr capsule Take 3 capsules by mouth daily.  12  . fluticasone (FLONASE) 50 MCG/ACT nasal spray Place 2 sprays into both nostrils daily. (Patient not taking: Reported on 01/29/2016) 16 g 1  . montelukast (SINGULAIR) 10 MG tablet Take 1 tablet (10 mg total) by mouth at bedtime. (Patient not taking: Reported on 01/29/2016) 30 tablet 3   No current facility-administered medications on file prior to visit.       Review of Systems Constitutional:   No  weight loss, night sweats,  Fevers, chills, fatigue, or   lassitude.  HEENT:   No headaches,  Difficulty swallowing,  Tooth/dental problems, or  Sore throat,                No sneezing, itching, ear ache, nasal congestion, post nasal drip,   CV:  No chest pain,  Orthopnea, PND, swelling in lower extremities, anasarca, dizziness, palpitations, syncope.   GI  No heartburn, indigestion, abdominal pain, nausea, vomiting, diarrhea, change in bowel habits, loss of appetite, bloody stools.   Resp: No shortness of breath with exertion or at rest.  No excess mucus, no productive cough,  No non-productive cough,  No coughing up of blood.  No change in color of mucus.  No wheezing.  No chest wall deformity  Skin: no rash or lesions.  GU: no dysuria, change in color of urine, no urgency or frequency.  No flank pain, no hematuria   MS:  No joint pain or swelling.  No decreased range of motion.  No back pain.  Psych:  No change in mood or affect. No depression or anxiety.  No memory loss.         Objective:   Physical Exam  Vitals:   01/29/16 0951  BP: 120/74  Pulse: (!) 104  SpO2: 96%  Weight: 180  lb (81.6 kg)  Height: 5\' 6"  (1.676 m)  Body mass index is 29.05 kg/m.   GEN: A/Ox3; pleasant , NAD, well nourished    HEENT:  [place]/AT,  EACs-clear, TMs-wnl, NOSE-clear, THROAT-clear, no lesions, no postnasal drip or exudate noted. Class 1 MP airway   NECK:  Supple w/ fair ROM; no JVD; normal carotid impulses w/o bruits; no thyromegaly or nodules palpated; no lymphadenopathy.    RESP  Clear  P & A; w/o, wheezes/ rales/ or rhonchi. no accessory muscle use, no dullness to percussion  CARD:  RRR, no m/r/g  , no peripheral edema, pulses intact, no cyanosis or clubbing.  GI:   Soft & nt; nml bowel sounds; no organomegaly or masses detected.   Musco: Warm bil, no deformities or joint swelling noted.   Neuro: alert, no focal deficits noted.    Skin: Warm, no lesions or rashes  Courtney Fenlon NP-C  [facility]  01/29/2016        Assessment & Plan:

## 2016-02-01 ENCOUNTER — Ambulatory Visit: Payer: Self-pay | Admitting: Adult Health

## 2016-03-05 NOTE — Progress Notes (Signed)
Reviewed & agree with plan  

## 2016-07-04 ENCOUNTER — Encounter: Payer: Self-pay | Admitting: Nurse Practitioner

## 2016-07-04 ENCOUNTER — Ambulatory Visit (INDEPENDENT_AMBULATORY_CARE_PROVIDER_SITE_OTHER): Payer: Self-pay | Admitting: Nurse Practitioner

## 2016-07-04 VITALS — BP 118/80 | HR 96 | Resp 20 | Ht 66.0 in | Wt 173.0 lb

## 2016-07-04 DIAGNOSIS — N390 Urinary tract infection, site not specified: Secondary | ICD-10-CM

## 2016-07-04 DIAGNOSIS — R319 Hematuria, unspecified: Secondary | ICD-10-CM

## 2016-07-04 LAB — POCT URINALYSIS DIPSTICK
BILIRUBIN UA: NEGATIVE
GLUCOSE UA: NEGATIVE
KETONES UA: NEGATIVE
Nitrite, UA: NEGATIVE
Protein, UA: NEGATIVE
Urobilinogen, UA: 0.2 E.U./dL
pH, UA: 6.5 (ref 5.0–8.0)

## 2016-07-04 MED ORDER — FLUCONAZOLE 150 MG PO TABS: 150.0000 mg | ORAL_TABLET | Freq: Once | ORAL | 0 refills | Status: AC

## 2016-07-04 MED ORDER — CIPROFLOXACIN HCL 500 MG PO TABS: 500.0000 mg | ORAL_TABLET | Freq: Two times a day (BID) | ORAL | 0 refills | Status: DC

## 2016-07-04 NOTE — Patient Instructions (Signed)

## 2016-07-04 NOTE — Progress Notes (Signed)
51 y.o.Separated Caucasian female G3P0 here with complaint of UTI, with onset  on 06/04/16. Patient complaining of:  flank pain and RLQ pain, and urgency. Patient denies fever, chills, nausea. No new personal products. Patient has burning at the urethra with sexual activity. She does take Macrobid post coital except for the last time when she did not have med's with her.    She did a medical visit online 4 days ago with her current symptoms and was given Macrobid 100 mg to take BID.  Symptoms are not better.   Denies vaginal symptoms.   Contraception is hysterectomy, ovaries remain. She even thought some of the pain in RLQ could be related to a cyst.   New change in partner x 4 months. She declines having repeat STD's at this time.  Last done 10/17 and were normal.  Not Menopausal and no vaginal dryness. Patient does not have adequate water intake   O: Healthy female WDWN Affect: Normal, orientation x 3 Skin : warm and dry CVAT: negative bilateral Abdomen: slight positive for suprapubic tenderness,  There is no rebounding. Some pain with deep palpation.  Pelvic exam: External genital area: normal, small lesion left labia from HSV Bladder, Urethral meatus: normal Vagina: normal vaginal discharge, normal appearance but will check Affirm to see if BV causing symptoms Adnexa: normal non tender, no fullness or masses  POCT:  Trace RBC, small leuk's  A:  UTI, R/O vaginal infection  History of post coital UTI and takes Macrobid PC  Normal pelvic exam, post hysterectomy  P: Reviewed findings of UTI and need for treatment. Rx: change to Cipro 500 mg BID # 14 WUJ:WJXBJLab:Urine micro, culture, Affirm Reviewed warning signs and symptoms of UTI and need to advise if occurring. Encouraged to limit soda, tea, and coffee   RV prn

## 2016-07-04 NOTE — Progress Notes (Signed)
Encounter reviewed by Dr. Brook Amundson C. Silva.  

## 2016-07-05 ENCOUNTER — Other Ambulatory Visit: Payer: Self-pay | Admitting: Nurse Practitioner

## 2016-07-05 LAB — URINALYSIS, MICROSCOPIC ONLY
BACTERIA UA: NONE SEEN [HPF]
CRYSTALS: NONE SEEN [HPF]
Casts: NONE SEEN [LPF]
RBC / HPF: NONE SEEN RBC/HPF (ref <=2)
SQUAMOUS EPITHELIAL / LPF: NONE SEEN [HPF] (ref <=5)
WBC UA: NONE SEEN WBC/HPF (ref <=5)
Yeast: NONE SEEN [HPF]

## 2016-07-05 LAB — WET PREP BY MOLECULAR PROBE
CANDIDA SPECIES: NOT DETECTED
Gardnerella vaginalis: DETECTED — AB
Trichomonas vaginosis: NOT DETECTED

## 2016-07-05 MED ORDER — METRONIDAZOLE 500 MG PO TABS: 500.0000 mg | ORAL_TABLET | Freq: Two times a day (BID) | ORAL | 0 refills | Status: DC

## 2016-07-05 NOTE — Progress Notes (Signed)
Pt decided on Flagyl for treatment of BV.

## 2016-07-06 ENCOUNTER — Ambulatory Visit (INDEPENDENT_AMBULATORY_CARE_PROVIDER_SITE_OTHER): Payer: Self-pay | Admitting: Obstetrics and Gynecology

## 2016-07-06 ENCOUNTER — Encounter: Payer: Self-pay | Admitting: Obstetrics and Gynecology

## 2016-07-06 VITALS — BP 110/78 | HR 88 | Temp 98.9°F | Resp 16 | Ht 66.0 in | Wt 173.0 lb

## 2016-07-06 DIAGNOSIS — R8290 Unspecified abnormal findings in urine: Secondary | ICD-10-CM

## 2016-07-06 DIAGNOSIS — R102 Pelvic and perineal pain: Secondary | ICD-10-CM

## 2016-07-06 DIAGNOSIS — M545 Low back pain, unspecified: Secondary | ICD-10-CM

## 2016-07-06 LAB — CBC WITH DIFFERENTIAL/PLATELET
BASOS ABS: 0 {cells}/uL (ref 0–200)
Basophils Relative: 0 %
EOS ABS: 182 {cells}/uL (ref 15–500)
Eosinophils Relative: 2 %
HCT: 42.5 % (ref 35.0–45.0)
Hemoglobin: 14.1 g/dL (ref 11.7–15.5)
LYMPHS PCT: 26 %
Lymphs Abs: 2366 cells/uL (ref 850–3900)
MCH: 30.6 pg (ref 27.0–33.0)
MCHC: 33.2 g/dL (ref 32.0–36.0)
MCV: 92.2 fL (ref 80.0–100.0)
MONOS PCT: 8 %
MPV: 9.6 fL (ref 7.5–12.5)
Monocytes Absolute: 728 cells/uL (ref 200–950)
Neutro Abs: 5824 cells/uL (ref 1500–7800)
Neutrophils Relative %: 64 %
PLATELETS: 364 10*3/uL (ref 140–400)
RBC: 4.61 MIL/uL (ref 3.80–5.10)
RDW: 13.6 % (ref 11.0–15.0)
WBC: 9.1 10*3/uL (ref 3.8–10.8)

## 2016-07-06 LAB — COMPREHENSIVE METABOLIC PANEL
ALT: 13 U/L (ref 6–29)
AST: 17 U/L (ref 10–35)
Albumin: 4.3 g/dL (ref 3.6–5.1)
Alkaline Phosphatase: 74 U/L (ref 33–115)
BUN: 12 mg/dL (ref 7–25)
CHLORIDE: 103 mmol/L (ref 98–110)
CO2: 26 mmol/L (ref 20–31)
Calcium: 9.6 mg/dL (ref 8.6–10.2)
Creat: 0.87 mg/dL (ref 0.50–1.10)
GLUCOSE: 98 mg/dL (ref 65–99)
POTASSIUM: 4.2 mmol/L (ref 3.5–5.3)
Sodium: 140 mmol/L (ref 135–146)
Total Bilirubin: 0.3 mg/dL (ref 0.2–1.2)
Total Protein: 7.3 g/dL (ref 6.1–8.1)

## 2016-07-06 LAB — URINE CULTURE

## 2016-07-06 NOTE — Progress Notes (Signed)
GYNECOLOGY  VISIT   HPI: 51 y.o.   Legally Separated  Caucasian  female   G3P0 with No LMP recorded. Patient has had a hysterectomy.  here for  Back pain/low-gradefever/pelvic pain; patient was seen 07/04/16 with PG for UTI symptoms; urine culture and mirco were negative   Recent reunion with partner.  Partner is uncircumcised.  No dryness of pain with intercourse.   Had intercourse without using Macrodantin prophylaxis.  Had odor and cloudy urine and lower abdominal pain.  Some urgency.  Low grad fever to 99. Little nausea and chills. No hematuria.   Worried about her kidney due to a finding on prior imaging.  CT and renal ultrasound showed 15 mm benign cyst.   Having urinary incontinence with intercourse and orgasm.   Saw Shirlyn Goltz on 07/04/16. UC negative - multiple organisms. Affirm showed bacterial vaginosis.  Now on Flagyl.   Urine dip today:  Trace protein, trace RBCs, and trace WBCs.   GYNECOLOGIC HISTORY: No LMP recorded. Patient has had a hysterectomy. Contraception:  Hysterectomy Menopausal hormone therapy: none Last mammogram: 08-02-12 Implants/Density 3/Neg/BiRads2:TBC Last pap smear: 2012 normal        OB History    Gravida Para Term Preterm AB Living   3         3   SAB TAB Ectopic Multiple Live Births                     Patient Active Problem List   Diagnosis Date Noted  . Dysuria 07/28/2015  . Severe recurrent major depression without psychotic features (HCC) 03/08/2015  . Anxiety 03/08/2015  . Generalized anxiety disorder 03/08/2015  . OSA (obstructive sleep apnea) 02/16/2015  . Excessive daytime sleepiness 01/13/2015  . ADD (attention deficit disorder) 08/29/2012  . Snoring 07/19/2012  . Hypercalcemia 07/19/2012  . 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 Adjustment disorder with mixed anxiety and depressed mood 07/03/2012  . Urinary incontinence 07/03/2012  . Asthmatic bronchitis 07/03/2012  . Allergic rhinitis 07/03/2012  . S/P hysterectomy  07/03/2012    Past Medical History:  Diagnosis Date  . Anxiety   . Arthritis    neck  . Attention deficit disorder   . Dyspareunia   . Dysrhythmia    currently being seen by Dr Sanjuana Kava  [phone]  . Headache(784.0)    due to neck - otc meds prn  . Herpes simplex type 1 infection   . PONV (postoperative nausea and vomiting)   . Seasonal allergies   . SVD (spontaneous vaginal delivery)    x 2  . Urinary incontinence     Past Surgical History:  Procedure Laterality Date  . ABDOMINAL HYSTERECTOMY  2001   [facility]-Dr. Miguel Aschoff  . ANTERIOR CERVICAL DECOMPRESSION/DISCECTOMY FUSION 4 LEVEL/HARDWARE REMOVAL N/A 11/16/2012   Procedure: Cervical Three-Four, Cervical Four-Five, Cervical Six-Seven, Cervical Seven-Thoracic One Anterior cervical decompression/diskectomy/fusion/possible removal of plate at A5-4;  Surgeon: Karn Cassis, MD;  Location: [facility];  Service: Neurosurgery;  Laterality: N/A;  ANTERIOR CERVICAL DECOMPRESSION/DISCECTOMY FUSION 4 LEVEL/HARDWARE REMOVAL  . AUGMENTATION MAMMAPLASTY  2002  . BREAST SURGERY    . CERVICAL FUSION     c3-4  . CYSTOSCOPY N/A 01/29/2013   Procedure: CYSTOSCOPY;  Surgeon: Martina Sinner, MD;  Location: [facility];  Service: Urology;  Laterality: N/A;  . INCONTINENCE SURGERY  03/2002   SPARC sling--Dr. Di Kindle in  [facility]  . PUBOVAGINAL SLING N/A 01/29/2013   Procedure: REMOVAL OF SLING ; INSERTION OF  XEROFORM GRAFT;  Surgeon: Martina Sinner, MD;  Location: [facility];  Service: Urology;  Laterality: N/A;  . WISDOM TOOTH EXTRACTION      Current Outpatient Prescriptions  Medication Sig Dispense Refill  . amphetamine-dextroamphetamine (ADDERALL XR) 30 MG 24 hr capsule Take 1 capsule by mouth daily.  0  . FLUoxetine (PROZAC) 20 MG capsule Take 60 mg by mouth daily.  12  . fluticasone (FLONASE) 50 MCG/ACT nasal spray Place 2 sprays into both nostrils daily. 16 g 1  . metroNIDAZOLE (FLAGYL) 500 MG tablet Take 1 tablet (500 mg  total) by mouth 2 (two) times daily. 14 tablet 0  . montelukast (SINGULAIR) 10 MG tablet Take 1 tablet (10 mg total) by mouth at bedtime. 30 tablet 3  . ciprofloxacin (CIPRO) 500 MG tablet Take 1 tablet (500 mg total) by mouth 2 (two) times daily. (Patient not taking: Reported on 07/06/2016) 14 tablet 0   No current facility-administered medications for this visit.      ALLERGIES: Adhesive [tape] and Latex  Family History  Problem Relation Age of Onset  . Depression Mother   . Asthma Mother   . Diabetes Father   . Hypertension Father   . Heart disease Father   . Kidney disease Father   . Heart failure Father   . Breast cancer Maternal Grandmother   . Hypertension Maternal Grandmother   . Hypertension Maternal Grandfather   . Hypertension Paternal Grandfather   . Stroke Paternal Grandfather   . CAD Paternal Uncle     Social History   Social History  . Marital status: Legally Separated    Spouse name: N/A  . Number of children: N/A  . Years of education: N/A   Occupational History  . Not on file.   Social History Main Topics  . Smoking status: Never Smoker  . Smokeless tobacco: Never Used  . Alcohol use 0.0 oz/week     Comment: socially  . Drug use: No  . Sexual activity: Yes    Partners: Male    Birth control/ protection: Surgical     Comment: [facility] 2001   Other Topics Concern  . Not on file   Social History Narrative  . No narrative on file    ROS:  Pertinent items are noted in HPI.  PHYSICAL EXAMINATION:    BP 110/78 (BP Location: Right Arm, Patient Position: Sitting, Cuff Size: Normal)   Pulse 88   Temp 98.9 F (37.2 C) (Oral)   Resp 16   Ht 5\' 6"  (1.676 m)   Wt 173 lb (78.5 kg)   BMI 27.92 kg/m     General appearance: alert, cooperative and appears stated age   Abdomen: soft, non-tender, no masses,  no organomegaly Back:  Mild left CVA tenderness.   Pelvic: External genitalia:  no lesions              Urethra:  normal appearing urethra with no  masses, tenderness or lesions              Bartholins and Skenes: normal                 Vagina: normal appearing vagina with normal color and discharge, no lesions              Cervix:  Absent.                 Bimanual Exam:  Uterus:  normal size, contour, position, consistency, mobility, non-tender  Adnexa: no mass, fullness, tenderness             Chaperone was present for exam.  ASSESSMENT  Low back pain.  Pelvic pain.  Dysuria.  Status post removal of spark sling and then placement of pubovaginal sling.   PLAN  CBC with diff, CMP.  GC/CT.  Urine micro and culture.  Restart Ciprofloxacin.  Ok to continue Flagyl.  Try AZO.  Hydrate well.  May need to return for pelvic ultrasound.   An After Visit Summary was printed and given to the patient.  __25____ minutes face to face time of which over 50% was spent in counseling.

## 2016-07-07 LAB — URINALYSIS, MICROSCOPIC ONLY
Bacteria, UA: NONE SEEN [HPF]
CASTS: NONE SEEN [LPF]
Crystals: NONE SEEN [HPF]
SQUAMOUS EPITHELIAL / LPF: NONE SEEN [HPF] (ref <=5)
WBC UA: NONE SEEN WBC/HPF (ref <=5)
Yeast: NONE SEEN [HPF]

## 2016-07-07 LAB — GC/CHLAMYDIA PROBE AMP
CT Probe RNA: NOT DETECTED
GC Probe RNA: NOT DETECTED

## 2016-07-08 LAB — URINE CULTURE: ORGANISM ID, BACTERIA: NO GROWTH

## 2016-07-15 ENCOUNTER — Ambulatory Visit: Payer: Self-pay | Admitting: Obstetrics and Gynecology

## 2016-10-28 ENCOUNTER — Telehealth: Payer: Self-pay

## 2016-10-28 NOTE — Telephone Encounter (Signed)
Pt.'s mom called to see if we could see pt. Today. I was unable to talk to the mom bc not on the hippa form. I did talk with the pt. Who was very hoarse. Pt was stated she was having facial swelling but not affecting her breathing. I told pt. We have not seen her for several years, but to call [facility] to see if they could work her in or go to the emergency room or an urgent care. Pt. Was ok with that and I told her I could make her the next available appointment and pt. Declined.

## 2016-11-15 ENCOUNTER — Encounter: Payer: Self-pay | Admitting: Pediatrics

## 2016-11-15 ENCOUNTER — Ambulatory Visit (INDEPENDENT_AMBULATORY_CARE_PROVIDER_SITE_OTHER): Payer: Self-pay | Admitting: Pediatrics

## 2016-11-15 VITALS — BP 130/100 | HR 90 | Temp 98.5°F | Resp 20 | Ht 64.96 in | Wt 172.8 lb

## 2016-11-15 DIAGNOSIS — K219 Gastro-esophageal reflux disease without esophagitis: Secondary | ICD-10-CM

## 2016-11-15 DIAGNOSIS — J208 Acute bronchitis due to other specified organisms: Secondary | ICD-10-CM | POA: Insufficient documentation

## 2016-11-15 DIAGNOSIS — J301 Allergic rhinitis due to pollen: Secondary | ICD-10-CM | POA: Insufficient documentation

## 2016-11-15 DIAGNOSIS — J45909 Unspecified asthma, uncomplicated: Secondary | ICD-10-CM | POA: Insufficient documentation

## 2016-11-15 DIAGNOSIS — J4530 Mild persistent asthma, uncomplicated: Secondary | ICD-10-CM

## 2016-11-15 MED ORDER — ALBUTEROL SULFATE HFA 108 (90 BASE) MCG/ACT IN AERS: INHALATION_SPRAY | RESPIRATORY_TRACT | 1 refills | Status: DC

## 2016-11-15 MED ORDER — AZITHROMYCIN 250 MG PO TABS: ORAL_TABLET | ORAL | 0 refills | Status: DC

## 2016-11-15 MED ORDER — OMEPRAZOLE 20 MG PO CPDR: DELAYED_RELEASE_CAPSULE | ORAL | 5 refills | Status: DC

## 2016-11-15 NOTE — Patient Instructions (Addendum)
Zyrtec 10 mg-take 1 tablet once a day if needed for runny nose Nasacort AQ- 2 sprays per nostril once a day if needed for stuffy nose Pro-air 2 puffs every 4 hours if needed for wheezing or coughing spells Montelukast  10 mg-take 1 tablet once a day for coughing or wheezing Zithromax 250 mg-take 2 tablets today then 1 tablet once a day for the next 4 days Omeprazole 20 mg-take 1 capsule once a day for reflux You  should have a flu vaccination this fall Call me if you're not doing well on this treatment plan She will monitor her blood pressure and make sure that it falls

## 2016-11-15 NOTE — Progress Notes (Signed)
[address] [place] [address] Dept: [phone]  FOLLOW UP NOTE  Patient ID: Kaitlyn Carter, female    DOB: 1966-01-21  Age: 51 y.o. MRN: [id number] Date of Office Visit: 11/15/2016  Assessment  Chief Complaint: No chief complaint on file.  HPI Kaitlyn Carter presents for evaluation of coughing and hoarseness for about 3 weeks. She had a normal chest x-ray. She she was given prednisone for a few days 2 weeks ago and that was followed by a steroid injection Her throat feels raw. She did not improve dramatically with prednisone. In the past she has used albuterol inhalers. She has been exposed to excessive amounts of mold recently. She was given montelukast 10 mg once a day . Her chest appears irritated particularly when she coughs.. In 2015, she was allergic to grass pollen, weeds, tree pollens and molds, cat, dog, horse  Current medications are outlined in the chart   Drug Allergies:  Allergies  Allergen Reactions  . Adhesive [Tape] Rash  . Latex Rash    Physical Exam: BP (!) 130/100   Pulse 90   Temp 98.5 F (36.9 C) (Oral)   Resp 20   Ht 5' 4.96" (1.65 m)   Wt 172 lb 13.5 oz (78.4 kg)   SpO2 98%   BMI 28.80 kg/m    Physical Exam  Constitutional: She is oriented to person, place, and time. She appears well-developed and well-nourished.  HENT:  Eyes normal. Ears normal. Nose normal. Pharynx normal.  Neck: Neck supple.  Cardiovascular:  S1 and S2 normal no murmurs  Pulmonary/Chest:  Clear to percussion and auscultation  Lymphadenopathy:    She has no cervical adenopathy.  Neurological: She is alert and oriented to person, place, and time.  Skin:  Clear  Psychiatric: She has a normal mood and affect. Her behavior is normal. Judgment and thought content normal.  Vitals reviewed.   Diagnostics:  FVC 3.58 L FEV1 2.77 L. Predicted FVC 3.69 L predicted FEV1 2.94 L. After albuterol 2 puffs FVC 3.65 L FEV1 2.91 L-the spirometry is in the normal range and there  was no significant improvement after albuterol  Assessment and Plan: 1. Mild persistent reactive airway disease without complication   2. Seasonal allergic rhinitis due to pollen   3. Acute bronchitis due to other specified organisms   4. Gastroesophageal reflux disease without esophagitis     Meds ordered this encounter  Medications  . albuterol (PROAIR HFA) 108 (90 Base) MCG/ACT inhaler    Sig: 2 puffs every 4 hours if needed for wheezing or coughing spells    Dispense:  1 Inhaler    Refill:  1  . azithromycin (ZITHROMAX) 250 MG tablet    Sig: Take 2 tablets today, then 1 tablet once a day for the next 4 days.    Dispense:  6 each    Refill:  0  . omeprazole (PRILOSEC) 20 MG capsule    Sig: Take 1 capsule once a day for reflux    Dispense:  30 capsule    Refill:  5    Patient Instructions  Zyrtec 10 mg-take 1 tablet once a day if needed for runny nose Nasacort AQ- 2 sprays per nostril once a day if needed for stuffy nose Pro-air 2 puffs every 4 hours if needed for wheezing or coughing spells Montelukast  10 mg-take 1 tablet once a day for coughing or wheezing Zithromax 250 mg-take 2 tablets today then 1 tablet once a day for the  next 4 days Omeprazole 20 mg-take 1 capsule once a day for reflux You  should have a flu vaccination this fall Call me if you're not doing well on this treatment plan She will monitor her blood pressure and make sure that it falls   Return if symptoms worsen or fail to improve.    Thank you for the opportunity to care for this patient.  Please do not hesitate to contact me with questions.  Tonette Bihari, M.D.  [facility] [address] [place], [place] [address] [phone]

## 2017-01-30 ENCOUNTER — Ambulatory Visit: Payer: Self-pay | Admitting: Adult Health

## 2017-04-07 ENCOUNTER — Ambulatory Visit: Payer: Self-pay | Admitting: Adult Health

## 2017-11-06 ENCOUNTER — Ambulatory Visit: Payer: Medicaid Other | Admitting: Adult Health

## 2017-11-06 ENCOUNTER — Encounter: Payer: Self-pay | Admitting: Adult Health

## 2017-11-06 DIAGNOSIS — G4733 Obstructive sleep apnea (adult) (pediatric): Secondary | ICD-10-CM

## 2017-11-06 NOTE — Assessment & Plan Note (Signed)
Mild OSA - pt education given  Compliance encouraged. Change to dreamwear mask   Plan  Patient Instructions  Continue on CPAP At bedtime  .  Goal is to wear for at least 4-6 hrs each night .  Do not drive if sleepy.  Use saline nasal rinses and nasal gel As needed   Work on healthy weight .  Follow up  In 1 year with Dr. Vassie Loll  Or Rogelio Winbush NP and As needed

## 2017-11-06 NOTE — Assessment & Plan Note (Signed)
Wt loss  

## 2017-11-06 NOTE — Progress Notes (Signed)
@Patient  ID: Kaitlyn Carter, female    DOB: February 15, 1966, 52 y.o.   MRN: [id number]  Chief Complaint  Patient presents with  . Follow-up    OSA     Referring provider: Marisue Brooklyn  HPI: 52 year old female followed for sleep apnea.  Initial consult June 2014 Past medical history significant for cervical spine disease status post surgery  TEST /Events  HST on 01/13/15 that showed AHI 13.4/hr , she did have mixed events with +centrals evident. SaO2 low at 82%.   11/06/2017 Follow up : OSA  Returns for a follow-up for sleep apnea.  Patient was last seen December 2017.  Patient says she tries to wear her CPAP each night but misses occasionally . Feels better when she wear it .  CPAP download shows 57% usage.  Patient is on CPAP 5-15 summers H2O.  AHI 2.6. Avg usage at 8hr .  She was encouraged on compliance and usage.  Patient education on sleep apnea and potential comp occasions of uncontrolled sleep apnea.. Has changed to full face mask , it is working okay but she feels air leaks at times and this wakes her up .   Allergies  Allergen Reactions  . Adhesive [Tape] Rash  . Latex Rash    There is no immunization history for the selected administration types on file for this patient.  Past Medical History:  Diagnosis Date  . Anxiety   . Arthritis    neck  . Attention deficit disorder   . Dyspareunia   . Dysrhythmia    currently being seen by Dr Sanjuana Kava  [phone]  . Headache(784.0)    due to neck - otc meds prn  . Herpes simplex type 1 infection   . PONV (postoperative nausea and vomiting)   . Seasonal allergies   . SVD (spontaneous vaginal delivery)    x 2  . Urinary incontinence     Tobacco History: Social History   Tobacco Use  Smoking Status Never Smoker  Smokeless Tobacco Never Used   Counseling given: Not Answered   Outpatient Medications Prior to Visit  Medication Sig Dispense Refill  . albuterol (PROAIR HFA) 108 (90 Base) MCG/ACT inhaler 2 puffs  every 4 hours if needed for wheezing or coughing spells 1 Inhaler 1  . amphetamine-dextroamphetamine (ADDERALL XR) 30 MG 24 hr capsule Take 1 capsule by mouth as needed.   0  . FLUoxetine (PROZAC) 20 MG capsule Take 60 mg by mouth daily.  12  . fluticasone (FLONASE) 50 MCG/ACT nasal spray Place 2 sprays into both nostrils daily. 16 g 1  . montelukast (SINGULAIR) 10 MG tablet Take 1 tablet (10 mg total) by mouth at bedtime. 30 tablet 3  . omeprazole (PRILOSEC) 20 MG capsule Take 1 capsule once a day for reflux 30 capsule 5  . azithromycin (ZITHROMAX) 250 MG tablet Take 2 tablets today, then 1 tablet once a day for the next 4 days. (Patient not taking: Reported on 11/06/2017) 6 each 0  . ciprofloxacin (CIPRO) 500 MG tablet Take 1 tablet (500 mg total) by mouth 2 (two) times daily. (Patient not taking: Reported on 07/06/2016) 14 tablet 0  . metroNIDAZOLE (FLAGYL) 500 MG tablet Take 1 tablet (500 mg total) by mouth 2 (two) times daily. (Patient not taking: Reported on 11/15/2016) 14 tablet 0   No facility-administered medications prior to visit.      Review of Systems  Constitutional:   No  weight loss, night sweats,  Fevers, chills, + fatigue,  or  lassitude.  HEENT:   No headaches,  Difficulty swallowing,  Tooth/dental problems, or  Sore throat,                No sneezing, itching, ear ache, nasal congestion, post nasal drip,   CV:  No chest pain,  Orthopnea, PND, swelling in lower extremities, anasarca, dizziness, palpitations, syncope.   GI  No heartburn, indigestion, abdominal pain, nausea, vomiting, diarrhea, change in bowel habits, loss of appetite, bloody stools.   Resp: No shortness of breath with exertion or at rest.  No excess mucus, no productive cough,  No non-productive cough,  No coughing up of blood.  No change in color of mucus.  No wheezing.  No chest wall deformity  Skin: no rash or lesions.  GU: no dysuria, change in color of urine, no urgency or frequency.  No flank pain, no  hematuria       Physical Exam  BP 130/86 (BP Location: Left Arm, Cuff Size: Normal)   Pulse 91   Ht 5\' 5"  (1.651 m)   Wt 188 lb 6.4 oz (85.5 kg)   SpO2 98%   BMI 31.35 kg/m   GEN: A/Ox3; pleasant , NAD, well nourished    HEENT:  [place]/AT,  EACs-clear, TMs-wnl, NOSE-clear, THROAT-clear, no lesions, no postnasal drip or exudate noted. Class 2 MP airway   NECK:  Supple w/ fair ROM; no JVD; normal carotid impulses w/o bruits; no thyromegaly or nodules palpated; no lymphadenopathy.    RESP  Clear  P & A; w/o, wheezes/ rales/ or rhonchi. no accessory muscle use, no dullness to percussion  CARD:  RRR, no m/r/g, no peripheral edema, pulses intact, no cyanosis or clubbing.  GI:   Soft & nt; nml bowel sounds; no organomegaly or masses detected.   Musco: Warm bil, no deformities or joint swelling noted.   Neuro: alert, no focal deficits noted.    Skin: Warm, no lesions or rashes    Lab Results:  CBC    Component Value Date/Time   WBC 9.1 07/06/2016 1631   RBC 4.61 07/06/2016 1631   HGB 14.1 07/06/2016 1631   HCT 42.5 07/06/2016 1631   PLT 364 07/06/2016 1631   MCV 92.2 07/06/2016 1631   MCH 30.6 07/06/2016 1631   MCHC 33.2 07/06/2016 1631   RDW 13.6 07/06/2016 1631   LYMPHSABS 2,366 07/06/2016 1631   MONOABS 728 07/06/2016 1631   EOSABS 182 07/06/2016 1631   BASOSABS 0 07/06/2016 1631    BMET    Component Value Date/Time   NA 140 07/06/2016 1631   K 4.2 07/06/2016 1631   CL 103 07/06/2016 1631   CO2 26 07/06/2016 1631   GLUCOSE 98 07/06/2016 1631   BUN 12 07/06/2016 1631   CREATININE 0.87 07/06/2016 1631   CALCIUM 9.6 07/06/2016 1631   GFRNONAA >60 03/08/2015 0038   GFRAA >60 03/08/2015 0038    BNP No results found for: BNP  ProBNP No results found for: PROBNP  Imaging: No results found.   Assessment & Plan:   No problem-specific Assessment & Plan notes found for this encounter.     Rubye Oaks, NP 11/06/2017

## 2017-11-06 NOTE — Patient Instructions (Addendum)
Continue on CPAP At bedtime  .  Goal is to wear for at least 4-6 hrs each night .  Do not drive if sleepy.  Use saline nasal rinses and nasal gel As needed   Work on healthy weight .  Follow up  In 1 year with Dr. Vassie Loll  Or Parrett NP and As needed

## 2018-03-02 ENCOUNTER — Telehealth: Payer: Self-pay | Admitting: Adult Health

## 2018-03-02 NOTE — Telephone Encounter (Signed)
Spoke with pt. She is needing a letter from [place]. Pt is needing a letter stating that since she started seeing [place] that her husband would come to her appointments with her. She is needing this for legal purposes. States that she spoke with Tammy about this at her last OV in September 2019.  Tammy - please advise. Thanks.

## 2018-03-02 NOTE — Telephone Encounter (Signed)
Attempted to call pt but unable to reach her. Left message for pt to return call. 

## 2018-03-02 NOTE — Telephone Encounter (Signed)
Pt is returning call. Cb is [phone].

## 2018-03-05 NOTE — Telephone Encounter (Signed)
Pt is calling back [phone]

## 2018-03-05 NOTE — Telephone Encounter (Signed)
Called and spoke with pt letting her know that we were still awaiting a response from TP to see if she is okay with writing the needed letter for pt. Stated to her that we would call her as soon as we had a response from TP. Pt expressed understanding.  Routing back to TP for her to review.

## 2018-03-05 NOTE — Telephone Encounter (Signed)
Called spoke with patient to inquire about some additional information regarding what the letter needs to say Will discuss with TP Patient stated she would like to pick up letter when done

## 2018-03-06 NOTE — Telephone Encounter (Signed)
Per TP: so sorry but unable to create letter as there is no documentation that spouse accompanied patient to appts.  TP spoke with patient personally regarding this Nothing further needed; will sign off

## 2018-03-06 NOTE — Telephone Encounter (Signed)
Jess, please advise once letter is completed, thank you!

## 2018-08-30 ENCOUNTER — Other Ambulatory Visit: Payer: Self-pay | Admitting: Specialist

## 2018-08-30 DIAGNOSIS — Z1231 Encounter for screening mammogram for malignant neoplasm of breast: Secondary | ICD-10-CM

## 2018-09-10 ENCOUNTER — Other Ambulatory Visit: Payer: Self-pay | Admitting: Specialist

## 2018-09-10 DIAGNOSIS — R5381 Other malaise: Secondary | ICD-10-CM

## 2018-09-11 ENCOUNTER — Other Ambulatory Visit: Payer: Self-pay | Admitting: Specialist

## 2018-09-11 DIAGNOSIS — T8543XA Leakage of breast prosthesis and implant, initial encounter: Secondary | ICD-10-CM

## 2018-09-11 DIAGNOSIS — R5381 Other malaise: Secondary | ICD-10-CM

## 2018-09-25 ENCOUNTER — Other Ambulatory Visit: Payer: Self-pay | Admitting: Specialist

## 2018-09-25 ENCOUNTER — Other Ambulatory Visit: Payer: Medicaid Other

## 2018-09-25 ENCOUNTER — Other Ambulatory Visit: Payer: Self-pay

## 2018-09-25 ENCOUNTER — Ambulatory Visit
Admission: RE | Admit: 2018-09-25 | Discharge: 2018-09-25 | Disposition: A | Discharge status: Home / Self Care | Payer: Medicaid Other | Source: Ambulatory Visit | Attending: Specialist | Admitting: Specialist

## 2018-09-25 DIAGNOSIS — T8543XA Leakage of breast prosthesis and implant, initial encounter: Secondary | ICD-10-CM

## 2018-11-06 ENCOUNTER — Encounter: Payer: Self-pay | Admitting: Adult Health

## 2018-11-07 ENCOUNTER — Encounter: Payer: Self-pay | Admitting: Adult Health

## 2018-11-07 ENCOUNTER — Ambulatory Visit (INDEPENDENT_AMBULATORY_CARE_PROVIDER_SITE_OTHER): Payer: Medicaid Other | Admitting: Adult Health

## 2018-11-07 ENCOUNTER — Other Ambulatory Visit: Payer: Self-pay

## 2018-11-07 VITALS — BP 110/74 | HR 68 | Temp 98.0°F | Ht 65.0 in | Wt 185.8 lb

## 2018-11-07 DIAGNOSIS — G4733 Obstructive sleep apnea (adult) (pediatric): Secondary | ICD-10-CM

## 2018-11-07 NOTE — Assessment & Plan Note (Signed)
-   Weight loss 

## 2018-11-07 NOTE — Patient Instructions (Addendum)
Continue on CPAP At bedtime  .  CPAP pressure adjust to 8 to 15cmH20 Goal is to wear for at least 4-6 hrs each night .  Do not drive if sleepy.  Use saline nasal rinses and nasal gel As needed   Work on healthy weight .  Follow up  In 1 year with Dr. Elsworth Soho  Or Erwin Nishiyama NP and As needed

## 2018-11-07 NOTE — Progress Notes (Signed)
@Patient  ID: Kaitlyn Carter, female    DOB: 27-Dec-1965, 53 y.o.   MRN: [id number]  Chief Complaint  Patient presents with  . Follow-up    OSA    Referring provider: Elise Benne  HPI: 63 -year-old female followed for sleep apnea.  Initial consult June 2014 Past medical history significant for cervical spine disease status post surgery ADD   TEST /Events  HST on 01/13/15 that showed AHI 13.4/hr , she did have mixed events with +centrals evident. SaO2 low at 82%.  11/07/2018 Follow up: OSA  Patient returns for a one-year follow-up for obstructive sleep apnea.  Patient is on nocturnal CPAP.  Says she is trying to wear her machine more each time.  Says she recently got a new mask and that is helping.  Download shows improved compliance over the last 2 weeks.  Daily average usage around 7 hours.  Patient is on auto CPAP 5 to 15 cm H2O.  Minimum leaks. Feels pressure might be low at times  She has gained some weight , discussed healthy weight loss.     Allergies  Allergen Reactions  . Adhesive [Tape] Rash  . Latex Rash    There is no immunization history for the selected administration types on file for this patient.  Past Medical History:  Diagnosis Date  . Anxiety   . Arthritis    neck  . Attention deficit disorder   . Dyspareunia   . Dysrhythmia    currently being seen by Dr Julianne Handler  [phone]  . Headache(784.0)    due to neck - otc meds prn  . Herpes simplex type 1 infection   . PONV (postoperative nausea and vomiting)   . Seasonal allergies   . SVD (spontaneous vaginal delivery)    x 2  . Urinary incontinence     Tobacco History: Social History   Tobacco Use  Smoking Status Never Smoker  Smokeless Tobacco Never Used   Counseling given: Not Answered   Outpatient Medications Prior to Visit  Medication Sig Dispense Refill  . albuterol (PROAIR HFA) 108 (90 Base) MCG/ACT inhaler 2 puffs every 4 hours if needed for wheezing or coughing spells 1  Inhaler 1  . amphetamine-dextroamphetamine (ADDERALL XR) 30 MG 24 hr capsule Take 1 capsule by mouth as needed.   0  . FLUoxetine (PROZAC) 20 MG capsule Take 60 mg by mouth daily.  12  . fluticasone (FLONASE) 50 MCG/ACT nasal spray Place 2 sprays into both nostrils daily. 16 g 1  . montelukast (SINGULAIR) 10 MG tablet Take 1 tablet (10 mg total) by mouth at bedtime. 30 tablet 3  . omeprazole (PRILOSEC) 20 MG capsule Take 1 capsule once a day for reflux 30 capsule 5   No facility-administered medications prior to visit.      Review of Systems:   Constitutional:   No  weight loss, night sweats,  Fevers, chills, fatigue, or  lassitude.  HEENT:   No headaches,  Difficulty swallowing,  Tooth/dental problems, or  Sore throat,                No sneezing, itching, ear ache, nasal congestion, post nasal drip,   CV:  No chest pain,  Orthopnea, PND, swelling in lower extremities, anasarca, dizziness, palpitations, syncope.   GI  No heartburn, indigestion, abdominal pain, nausea, vomiting, diarrhea, change in bowel habits, loss of appetite, bloody stools.   Resp: No shortness of breath with exertion or at rest.  No excess mucus, no  productive cough,  No non-productive cough,  No coughing up of blood.  No change in color of mucus.  No wheezing.  No chest wall deformity  Skin: no rash or lesions.  GU: no dysuria, change in color of urine, no urgency or frequency.  No flank pain, no hematuria   MS:  No joint pain or swelling.  No decreased range of motion.  No back pain.    Physical Exam  BP 110/74 (BP Location: Left Arm, Cuff Size: Normal)   Pulse 68   Temp 98 F (36.7 C) (Oral)   Ht 5\' 5"  (1.651 m)   Wt 185 lb 12.8 oz (84.3 kg)   SpO2 97%   BMI 30.92 kg/m   GEN: A/Ox3; pleasant , NAD    HEENT:  [place]/AT,  NOSE-clear, THROAT-clear, no lesions, no postnasal drip or exudate noted. Class 2 MP airway   NECK:  Supple w/ fair ROM; no JVD; normal carotid impulses w/o bruits; no thyromegaly or  nodules palpated; no lymphadenopathy.    RESP  Clear  P & A; w/o, wheezes/ rales/ or rhonchi. no accessory muscle use, no dullness to percussion  CARD:  RRR, no m/r/g, no peripheral edema, pulses intact, no cyanosis or clubbing.  GI:   Soft & nt; nml bowel sounds; no organomegaly or masses detected.   Musco: Warm bil, no deformities or joint swelling noted.   Neuro: alert, no focal deficits noted.    Skin: Warm, no lesions or rashes    Lab Results:  BNP No results found for: BNP  ProBNP No results found for: PROBNP  Imaging: No results found.    No flowsheet data found.  No results found for: NITRICOXIDE      Assessment & Plan:   OSA (obstructive sleep apnea) Good control on nocturnal CPAP.  Patient is encouraged on increased compliance. We will adjust CPAP pressure for comfort.  8 to 15 cm H2O.  Plan  Patient Instructions  Continue on CPAP At bedtime  .  CPAP pressure adjust to 8 to 15cmH20 Flu shot today  Goal is to wear for at least 4-6 hrs each night .  Do not drive if sleepy.  Use saline nasal rinses and nasal gel As needed   Work on healthy weight .  Follow up  In 1 year with Dr. Vassie LollAlva  Or Parrett NP and As needed       Morbid obesity (HCC) Weight loss      Rubye Oaksammy Parrett, NP 11/07/2018

## 2018-11-07 NOTE — Assessment & Plan Note (Addendum)
Good control on nocturnal CPAP.  Patient is encouraged on increased compliance. We will adjust CPAP pressure for comfort.  8 to 15 cm H2O.  Plan  Patient Instructions  Continue on CPAP At bedtime  .  CPAP pressure adjust to 8 to 15cmH20 Goal is to wear for at least 4-6 hrs each night .  Do not drive if sleepy.  Use saline nasal rinses and nasal gel As needed   Work on healthy weight .  Follow up  In 1 year with Dr. Elsworth Soho  Or Kenlee Maler NP and As needed

## 2019-04-25 ENCOUNTER — Encounter: Payer: Self-pay | Admitting: Family Medicine

## 2019-04-25 ENCOUNTER — Ambulatory Visit (HOSPITAL_BASED_OUTPATIENT_CLINIC_OR_DEPARTMENT_OTHER)
Admission: RE | Admit: 2019-04-25 | Discharge: 2019-04-25 | Disposition: A | Discharge status: Home / Self Care | Payer: Medicaid Other | Source: Ambulatory Visit | Attending: Family Medicine | Admitting: Family Medicine

## 2019-04-25 ENCOUNTER — Other Ambulatory Visit: Payer: Self-pay

## 2019-04-25 ENCOUNTER — Ambulatory Visit: Payer: Medicaid Other | Admitting: Family Medicine

## 2019-04-25 VITALS — BP 140/89 | Ht 65.0 in | Wt 175.0 lb

## 2019-04-25 DIAGNOSIS — G8929 Other chronic pain: Secondary | ICD-10-CM | POA: Insufficient documentation

## 2019-04-25 DIAGNOSIS — M545 Low back pain, unspecified: Secondary | ICD-10-CM | POA: Insufficient documentation

## 2019-04-25 MED ORDER — PREDNISONE 5 MG PO TABS: ORAL_TABLET | ORAL | 0 refills | Status: DC

## 2019-04-25 NOTE — Assessment & Plan Note (Signed)
Acute on chronic in nature.  May be related to SI joint dysfunction.  May have some malrotation of the lumbar spine and SI joint.  Seems less likely for nerve impingement. -X-ray. -Prednisone. -Counseled on home exercise therapy and supportive care. -Could consider physical therapy or SI joint injections.

## 2019-04-25 NOTE — Progress Notes (Signed)
Kaitlyn Carter - 54 y.o. female MRN [id number]  Date of birth: [birth year]  SUBJECTIVE:  Including CC & ROS.  Chief Complaint  Patient presents with  . Back Pain    bilateral low back    Kaitlyn Carter is a 54 y.o. female that is presenting with low back pain.  This is occurring in the midline of the lower back.  She was helping her friend at her work in October.  Since that time she has felt this popping and pain in the lower back.  Denies any radicular symptoms.  Symptoms are worse at night.  No history of surgery in the lower back.  Pain can be severe.   Review of Systems See HPI   HISTORY: Past Medical, Surgical, Social, and Family History Reviewed & Updated per EMR.   Pertinent Historical Findings include:  Past Medical History:  Diagnosis Date  . Anxiety   . Arthritis    neck  . Attention deficit disorder   . Dyspareunia   . Dysrhythmia    currently being seen by Dr Sanjuana Kava  [phone]  . Headache(784.0)    due to neck - otc meds prn  . Herpes simplex type 1 infection   . PONV (postoperative nausea and vomiting)   . Seasonal allergies   . SVD (spontaneous vaginal delivery)    x 2  . Urinary incontinence     Past Surgical History:  Procedure Laterality Date  . ABDOMINAL HYSTERECTOMY  2001   [facility]-Dr. Miguel Aschoff  . ANTERIOR CERVICAL DECOMPRESSION/DISCECTOMY FUSION 4 LEVEL/HARDWARE REMOVAL N/A 11/16/2012   Procedure: Cervical Three-Four, Cervical Four-Five, Cervical Six-Seven, Cervical Seven-Thoracic One Anterior cervical decompression/diskectomy/fusion/possible removal of plate at Q2-5;  Surgeon: Karn Cassis, MD;  Location: [facility];  Service: Neurosurgery;  Laterality: N/A;  ANTERIOR CERVICAL DECOMPRESSION/DISCECTOMY FUSION 4 LEVEL/HARDWARE REMOVAL  . AUGMENTATION MAMMAPLASTY  2002  . BREAST SURGERY    . CERVICAL FUSION     c3-4  . CYSTOSCOPY N/A 01/29/2013   Procedure: CYSTOSCOPY;  Surgeon: Martina Sinner, MD;  Location: [facility];  Service:  Urology;  Laterality: N/A;  . INCONTINENCE SURGERY  03/2002   SPARC sling--Dr. Di Kindle in  [facility]  . PUBOVAGINAL SLING N/A 01/29/2013   Procedure: REMOVAL OF SLING ; INSERTION OF XEROFORM GRAFT;  Surgeon: Martina Sinner, MD;  Location: [facility];  Service: Urology;  Laterality: N/A;  . WISDOM TOOTH EXTRACTION      Family History  Problem Relation Age of Onset  . Depression Mother   . Asthma Mother   . Diabetes Father   . Hypertension Father   . Heart disease Father   . Kidney disease Father   . Heart failure Father   . Breast cancer Maternal Grandmother   . Hypertension Maternal Grandmother   . Hypertension Maternal Grandfather   . Hypertension Paternal Grandfather   . Stroke Paternal Grandfather   . CAD Paternal Uncle     Social History   Socioeconomic History  . Marital status: Legally Separated    Spouse name: Not on file  . Number of children: Not on file  . Years of education: Not on file  . Highest education level: Not on file  Occupational History  . Not on file  Tobacco Use  . Smoking status: Never Smoker  . Smokeless tobacco: Never Used  Substance and Sexual Activity  . Alcohol use: Yes    Alcohol/week: 0.0 standard drinks    Comment: socially  . Drug use:  No  . Sexual activity: Yes    Partners: Male    Birth control/protection: Surgical    Comment: [facility] 2001  Other Topics Concern  . Not on file  Social History Narrative  . Not on file   Social Determinants of Health   Financial Resource Strain:   . Difficulty of Paying Living Expenses: Not on file  Food Insecurity:   . Worried About Charity fundraiser in the Last Year: Not on file  . Ran Out of Food in the Last Year: Not on file  Transportation Needs:   . Lack of Transportation (Medical): Not on file  . Lack of Transportation (Non-Medical): Not on file  Physical Activity:   . Days of Exercise per Week: Not on file  . Minutes of Exercise per Session: Not on file  Stress:   .  Feeling of Stress : Not on file  Social Connections:   . Frequency of Communication with Friends and Family: Not on file  . Frequency of Social Gatherings with Friends and Family: Not on file  . Attends Religious Services: Not on file  . Active Member of Clubs or Organizations: Not on file  . Attends Archivist Meetings: Not on file  . Marital Status: Not on file  Intimate Partner Violence:   . Fear of Current or Ex-Partner: Not on file  . Emotionally Abused: Not on file  . Physically Abused: Not on file  . Sexually Abused: Not on file     PHYSICAL EXAM:  VS: BP 140/89   Ht 5\' 5"  (1.651 m)   Wt 175 lb (79.4 kg)   BMI 29.12 kg/m  Physical Exam Gen: NAD, alert, cooperative with exam, well-appearing MSK:  Back: Some tenderness to palpation over the midline lumbar spine and SI joints. Normal hip flexion. Some instability with hip flexion and abduction. Negative straight leg raise. Normal FADIR and FABER.  Neurovascularly intact     ASSESSMENT & PLAN:   Chronic midline low back pain without sciatica Acute on chronic in nature.  May be related to SI joint dysfunction.  May have some malrotation of the lumbar spine and SI joint.  Seems less likely for nerve impingement. -X-ray. -Prednisone. -Counseled on home exercise therapy and supportive care. -Could consider physical therapy or SI joint injections.

## 2019-04-25 NOTE — Patient Instructions (Signed)
Nice to meet you Please try heat  Please try the exercises  I will call with the results.  Please send me a message in MyChart with any questions or updates.  Please see me back in 4 weeks.   --Dr. Jordan Likes

## 2019-04-26 ENCOUNTER — Telehealth: Payer: Self-pay | Admitting: Family Medicine

## 2019-04-26 NOTE — Telephone Encounter (Signed)
Left VM for patient. If she calls back please have her speak with a nurse/CMA and inform that the xray showed a deformity at L3 but this was seen on the CT from 2014 and is unchanged. I think the exercises are still a good starting point and then see how she responds.   If any questions then please take the best time and phone number to call and I will try to call her back.   Myra Rude, MD [facility] 04/26/2019, 9:29 AM

## 2019-04-26 NOTE — Telephone Encounter (Signed)
Spoke to patient and gave her result information as provided by the physician. 

## 2019-04-26 NOTE — Telephone Encounter (Signed)
Patient returning call for xray results  

## 2019-05-23 ENCOUNTER — Ambulatory Visit: Payer: Medicaid Other | Admitting: Family Medicine

## 2019-05-24 ENCOUNTER — Ambulatory Visit: Payer: Medicaid Other | Admitting: Medical

## 2019-05-24 ENCOUNTER — Other Ambulatory Visit: Payer: Self-pay

## 2019-05-30 ENCOUNTER — Ambulatory Visit: Payer: Medicaid Other | Admitting: Family Medicine

## 2019-05-30 ENCOUNTER — Other Ambulatory Visit: Payer: Self-pay

## 2019-05-30 VITALS — BP 110/80 | Ht 65.0 in | Wt 175.0 lb

## 2019-05-30 DIAGNOSIS — M545 Low back pain: Secondary | ICD-10-CM

## 2019-05-30 DIAGNOSIS — G8929 Other chronic pain: Secondary | ICD-10-CM | POA: Diagnosis not present

## 2019-05-30 NOTE — Progress Notes (Signed)
Medication Samples have been provided to the patient.  Drug name: duexix       Strength: 800mg /26.6mg       Qty: 2 bxs  LOT:  Exp.Date: 2/22  Dosing instructions: take one tab three times a day  The patient has been instructed regarding the correct time, dose, and frequency of taking this medication, including desired effects and most common side effects.   3/22 4:15 PM 05/30/2019

## 2019-05-30 NOTE — Progress Notes (Signed)
Kaitlyn Carter - 54 y.o. female MRN [id number]  Date of birth: Apr 27, 1965  SUBJECTIVE:  Including CC & ROS.  No chief complaint on file.   Kaitlyn Carter is a 54 y.o. female that is presenting with acute worsening of her low back pain. Pain has been ongoing since October. Little improvement with interventions thus far. Had improvement with prednisone but pain returned. Pain is midline.  Independent review of the lumbar spine x-ray from 2/25 shows a chronic deformity of the anterior superior endplate of L3.  No changes from prior CT scan.   Review of Systems See HPI   HISTORY: Past Medical, Surgical, Social, and Family History Reviewed & Updated per EMR.   Pertinent Historical Findings include:  Past Medical History:  Diagnosis Date  . Anxiety   . Arthritis    neck  . Attention deficit disorder   . Dyspareunia   . Dysrhythmia    currently being seen by Dr Sanjuana Kava  [phone]  . Headache(784.0)    due to neck - otc meds prn  . Herpes simplex type 1 infection   . PONV (postoperative nausea and vomiting)   . Seasonal allergies   . SVD (spontaneous vaginal delivery)    x 2  . Urinary incontinence     Past Surgical History:  Procedure Laterality Date  . ABDOMINAL HYSTERECTOMY  2001   [facility]-Dr. Miguel Aschoff  . ANTERIOR CERVICAL DECOMPRESSION/DISCECTOMY FUSION 4 LEVEL/HARDWARE REMOVAL N/A 11/16/2012   Procedure: Cervical Three-Four, Cervical Four-Five, Cervical Six-Seven, Cervical Seven-Thoracic One Anterior cervical decompression/diskectomy/fusion/possible removal of plate at C1-6;  Surgeon: Karn Cassis, MD;  Location: [facility];  Service: Neurosurgery;  Laterality: N/A;  ANTERIOR CERVICAL DECOMPRESSION/DISCECTOMY FUSION 4 LEVEL/HARDWARE REMOVAL  . AUGMENTATION MAMMAPLASTY  2002  . BREAST SURGERY    . CERVICAL FUSION     c3-4  . CYSTOSCOPY N/A 01/29/2013   Procedure: CYSTOSCOPY;  Surgeon: Martina Sinner, MD;  Location: [facility];  Service: Urology;  Laterality:  N/A;  . INCONTINENCE SURGERY  03/2002   SPARC sling--[facility] Di Kindle in  [facility]  . PUBOVAGINAL SLING N/A 01/29/2013   Procedure: REMOVAL OF SLING ; INSERTION OF XEROFORM GRAFT;  Surgeon: Martina Sinner, MD;  Location: [facility];  Service: Urology;  Laterality: N/A;  . WISDOM TOOTH EXTRACTION      Family History  Problem Relation Age of Onset  . Depression Mother   . Asthma Mother   . Diabetes Father   . Hypertension Father   . Heart disease Father   . Kidney disease Father   . Heart failure Father   . Breast cancer Maternal Grandmother   . Hypertension Maternal Grandmother   . Hypertension Maternal Grandfather   . Hypertension Paternal Grandfather   . Stroke Paternal Grandfather   . CAD Paternal Uncle     Social History   Socioeconomic History  . Marital status: Legally Separated    Spouse name: Not on file  . Number of children: Not on file  . Years of education: Not on file  . Highest education level: Not on file  Occupational History  . Not on file  Tobacco Use  . Smoking status: Never Smoker  . Smokeless tobacco: Never Used  Substance and Sexual Activity  . Alcohol use: Yes    Alcohol/week: 0.0 standard drinks    Comment: socially  . Drug use: No  . Sexual activity: Yes    Partners: Male    Birth control/protection: Surgical  Comment: [facility] 2001  Other Topics Concern  . Not on file  Social History Narrative  . Not on file   Social Determinants of Health   Financial Resource Strain:   . Difficulty of Paying Living Expenses:   Food Insecurity:   . Worried About Charity fundraiser in the Last Year:   . Arboriculturist in the Last Year:   Transportation Needs:   . Film/video editor (Medical):   Marland Kitchen Lack of Transportation (Non-Medical):   Physical Activity:   . Days of Exercise per Week:   . Minutes of Exercise per Session:   Stress:   . Feeling of Stress :   Social Connections:   . Frequency of Communication with Friends and Family:    . Frequency of Social Gatherings with Friends and Family:   . Attends Religious Services:   . Active Member of Clubs or Organizations:   . Attends Archivist Meetings:   Marland Kitchen Marital Status:   Intimate Partner Violence:   . Fear of Current or Ex-Partner:   . Emotionally Abused:   Marland Kitchen Physically Abused:   . Sexually Abused:      PHYSICAL EXAM:  VS: BP 110/80   Ht 5\' 5"  (1.651 m)   Wt 175 lb (79.4 kg)   BMI 29.12 kg/m  Physical Exam Gen: NAD, alert, cooperative with exam, well-appearing MSK:  Back:  Tenderness to palpation of the midline lumbar spine. Tenderness to palpation over the paraspinal lumbar muscles. No significant tenderness palpation of the SI joints. Normal internal and external rotation of the hips. Normal strength resistance to flexion. Positive straight leg raise. Neurovascular intact     ASSESSMENT & PLAN:   Chronic midline low back pain without sciatica Acute on chronic in nature. Pain is ongoing. Has degenerative changes on xray and seems more facet mediated.  - counseled on HEP and supportive care.  - provided duexis samples  - MRI lumbar spine to evaluate for facet arthritis or foraminal stenosis.

## 2019-05-30 NOTE — Patient Instructions (Signed)
Good to see you Please try the duexis. Let me know if this works better or the [employer] System.  Please try heat   Please send me a message in MyChart with any questions or updates.  We will set up a virtual visit once the MRI is resulted.   --Dr. Jordan Likes

## 2019-05-30 NOTE — Assessment & Plan Note (Signed)
Acute on chronic in nature. Pain is ongoing. Has degenerative changes on xray and seems more facet mediated.  - counseled on HEP and supportive care.  - provided duexis samples  - MRI lumbar spine to evaluate for facet arthritis or foraminal stenosis.

## 2019-06-12 ENCOUNTER — Ambulatory Visit
Admission: RE | Admit: 2019-06-12 | Discharge: 2019-06-12 | Disposition: A | Discharge status: Home / Self Care | Payer: Medicaid Other | Source: Ambulatory Visit | Attending: Family Medicine | Admitting: Family Medicine

## 2019-06-12 ENCOUNTER — Other Ambulatory Visit: Payer: Self-pay

## 2019-06-12 DIAGNOSIS — G8929 Other chronic pain: Secondary | ICD-10-CM

## 2019-06-18 ENCOUNTER — Other Ambulatory Visit: Payer: Self-pay

## 2019-06-18 ENCOUNTER — Telehealth (INDEPENDENT_AMBULATORY_CARE_PROVIDER_SITE_OTHER): Payer: Medicaid Other | Admitting: Family Medicine

## 2019-06-18 DIAGNOSIS — M48061 Spinal stenosis, lumbar region without neurogenic claudication: Secondary | ICD-10-CM

## 2019-06-18 DIAGNOSIS — G8929 Other chronic pain: Secondary | ICD-10-CM

## 2019-06-18 DIAGNOSIS — M545 Low back pain, unspecified: Secondary | ICD-10-CM

## 2019-06-18 NOTE — Assessment & Plan Note (Signed)
Having ongoing back pain with limited improvement thus far.  MRI was revealing for spinal stenosis.  Unclear if this is main contributor to her back pain. -Counseled on home exercise therapy and supportive care. -Try epidural and see if there is any resolution of her pain.

## 2019-06-18 NOTE — Progress Notes (Signed)
Virtual Visit via Video Note  I connected with Clois Comber on 06/18/19 at  1:30 PM EDT by a video enabled telemedicine application and verified that I am speaking with the correct person using two identifiers.   I discussed the limitations of evaluation and management by telemedicine and the availability of in person appointments. The patient expressed understanding and agreed to proceed.  History of Present Illness:  Ms. Hulon is a 54 year old female that is following up after her MRI of her lumbar spine.  She has had ongoing low back pain that is chronic in nature.  The MRI was revealing for spinal stenosis in different areas.  She does have intermittent pain that occurs in the thighs and side of the legs.  Denies any weakness or fatigue in the legs.  Observations/Objective:  Gen: NAD, alert, cooperative with exam, well-appearing   Assessment and Plan:  Low back pain: Having ongoing back pain with limited improvement thus far.  MRI was revealing for spinal stenosis.  Unclear if this is main contributor to her back pain. -Counseled on home exercise therapy and supportive care. -Try epidural and see if there is any resolution of her pain.  Follow Up Instructions:    I discussed the assessment and treatment plan with the patient. The patient was provided an opportunity to ask questions and all were answered. The patient agreed with the plan and demonstrated an understanding of the instructions.   The patient was advised to call back or seek an in-person evaluation if the symptoms worsen or if the condition fails to improve as anticipated.   Clare Gandy, MD

## 2019-06-19 ENCOUNTER — Telehealth: Payer: Self-pay | Admitting: Family Medicine

## 2019-06-19 NOTE — Telephone Encounter (Signed)
Patient called to ask opinion on taking neurontin to help with nerve pain instead of getting an ESI

## 2019-06-20 MED ORDER — GABAPENTIN 300 MG PO CAPS: 300.0000 mg | ORAL_CAPSULE | Freq: Three times a day (TID) | ORAL | 1 refills | Status: DC

## 2019-06-20 NOTE — Telephone Encounter (Signed)
Spoke with patient about symptoms. Will try gabapentin.   Myra Rude, MD [facility] 06/20/2019, 8:53 AM

## 2019-07-02 ENCOUNTER — Telehealth: Payer: Self-pay | Admitting: Family Medicine

## 2019-07-02 MED ORDER — DIAZEPAM 5 MG PO TABS: ORAL_TABLET | ORAL | 0 refills | Status: DC

## 2019-07-02 NOTE — Telephone Encounter (Signed)
Valium provided for ESI.   Myra Rude, MD [facility] 07/02/2019, 2:23 PM

## 2019-07-02 NOTE — Telephone Encounter (Signed)
Patient has ESI scheduled tomorrow, 07/03/19, and is requesting a prescription for Valium   Pharmacy: [facility] in [place]

## 2019-07-03 ENCOUNTER — Other Ambulatory Visit: Payer: Self-pay

## 2019-07-03 ENCOUNTER — Ambulatory Visit
Admission: RE | Admit: 2019-07-03 | Discharge: 2019-07-03 | Disposition: A | Discharge status: Home / Self Care | Payer: Medicaid Other | Source: Ambulatory Visit | Attending: Family Medicine | Admitting: Family Medicine

## 2019-07-03 DIAGNOSIS — G8929 Other chronic pain: Secondary | ICD-10-CM

## 2019-07-03 DIAGNOSIS — M48061 Spinal stenosis, lumbar region without neurogenic claudication: Secondary | ICD-10-CM

## 2019-07-03 MED ORDER — METHYLPREDNISOLONE ACETATE 40 MG/ML INJ SUSP (RADIOLOG
120.0000 mg | Freq: Once | INTRAMUSCULAR | Status: AC
  Administered 2019-07-03: 120 mg via EPIDURAL

## 2019-07-03 MED ORDER — IOPAMIDOL (ISOVUE-M 200) INJECTION 41%
1.0000 mL | Freq: Once | INTRAMUSCULAR | Status: AC
  Administered 2019-07-03: 1 mL via EPIDURAL

## 2019-07-03 NOTE — Discharge Instructions (Signed)

## 2019-11-07 ENCOUNTER — Ambulatory Visit: Payer: Medicaid Other | Admitting: Adult Health

## 2019-11-08 ENCOUNTER — Ambulatory Visit: Payer: Medicaid Other | Admitting: Adult Health

## 2019-11-11 ENCOUNTER — Other Ambulatory Visit: Payer: Self-pay | Admitting: Adult Health

## 2019-11-11 DIAGNOSIS — G4733 Obstructive sleep apnea (adult) (pediatric): Secondary | ICD-10-CM

## 2019-11-12 ENCOUNTER — Encounter: Payer: Self-pay | Admitting: Adult Health

## 2019-11-12 ENCOUNTER — Other Ambulatory Visit: Payer: Self-pay

## 2019-11-12 ENCOUNTER — Ambulatory Visit: Payer: Medicaid Other | Admitting: Adult Health

## 2019-11-12 VITALS — BP 110/78 | HR 97 | Temp 97.3°F | Ht 65.0 in | Wt 181.0 lb

## 2019-11-12 DIAGNOSIS — G4733 Obstructive sleep apnea (adult) (pediatric): Secondary | ICD-10-CM

## 2019-11-12 DIAGNOSIS — Z23 Encounter for immunization: Secondary | ICD-10-CM | POA: Diagnosis not present

## 2019-11-12 NOTE — Assessment & Plan Note (Signed)
Healthy weight loss discussed 

## 2019-11-12 NOTE — Patient Instructions (Addendum)
Continue on CPAP At bedtime  .  Goal is to wear for at least 4-6 hrs each night .  Do not drive if sleepy.  Use saline nasal rinses and nasal gel As needed   Work on healthy weight .  Flu shot .  Follow up  In 1 year with Dr. Vassie Loll  Or Kripa Foskey NP and As needed

## 2019-11-12 NOTE — Progress Notes (Signed)
@Patient  ID: , female    DOB: [birth year], 54 y.o.   MRN: [id number]  Chief Complaint  Patient presents with  . Follow-up    OSA     Referring provider: [id number]  HPI: 54 year old female followed for obstructive sleep apnea with initial consult in June 2014 Medical history significant for cervical spine disease status post surgery and ADD   TEST/EVENTS :  HST on 01/13/15 that showed AHI 13.4/hr , she did have mixed events with +centrals evident. SaO2 low at 82%.  11/12/2019 Follow up : OSA  Patient returns for a 1 year follow-up.  Patient has underlying obstructive sleep apnea.  She is on nocturnal CPAP.  Patient says she has been trying to wear her CPAP more over the last month.  She has had good usage for the last 30 days.  With daily average usage around 7.5 hours.  Patient is on auto CPAP 5 to 15 cm H2O.  AHI is 2.6.  Minimal leaks.  Patient says overall she is feeling much better wearing the CPAP , more rested and energy .  She does have attention deficit disorder and is on Adderall. Says well controlled.    Allergies  Allergen Reactions  . Adhesive [Tape] Rash  . Latex Rash    Immunization History  Administered Date(s) Administered  . PFIZER SARS-COV-2 Vaccination 09/26/2019, 10/17/2019    Past Medical History:  Diagnosis Date  . Anxiety   . Arthritis    neck  . Attention deficit disorder   . Dyspareunia   . Dysrhythmia    currently being seen by Dr 10/19/2019  [phone]  . Headache(784.0)    due to neck - otc meds prn  . Herpes simplex type 1 infection   . PONV (postoperative nausea and vomiting)   . Seasonal allergies   . SVD (spontaneous vaginal delivery)    x 2  . Urinary incontinence     Tobacco History: Social History   Tobacco Use  Smoking Status Never Smoker  Smokeless Tobacco Never Used   Counseling given: Not Answered   Outpatient Medications Prior to Visit  Medication Sig Dispense Refill  . albuterol  (PROAIR HFA) 108 (90 Base) MCG/ACT inhaler 2 puffs every 4 hours if needed for wheezing or coughing spells 1 Inhaler 1  . amphetamine-dextroamphetamine (ADDERALL XR) 30 MG 24 hr capsule Take 1 capsule by mouth as needed.   0  . buPROPion (WELLBUTRIN XL) 300 MG 24 hr tablet Take 300 mg by mouth daily.    . fluticasone (FLONASE) 50 MCG/ACT nasal spray Place 2 sprays into both nostrils daily. 16 g 1  . gabapentin (NEURONTIN) 300 MG capsule Take 1 capsule (300 mg total) by mouth 3 (three) times daily. 90 capsule 1  . sertraline (ZOLOFT) 100 MG tablet Take 100 mg by mouth daily.    . diazepam (VALIUM) 5 MG tablet May take 30 minutes prior to procedure. May repeat one time if needed 2 tablet 0  . FLUoxetine (PROZAC) 20 MG capsule Take 60 mg by mouth daily.  12  . montelukast (SINGULAIR) 10 MG tablet Take 1 tablet (10 mg total) by mouth at bedtime. 30 tablet 3  . omeprazole (PRILOSEC) 20 MG capsule Take 1 capsule once a day for reflux 30 capsule 5  . predniSONE (DELTASONE) 5 MG tablet Take 6 pills for first day, 5 pills second day, 4 pills third day, 3 pills fourth day, 2 pills the fifth day, and 1 pill sixth day.  21 tablet 0   No facility-administered medications prior to visit.     Review of Systems:   Constitutional:   No  weight loss, night sweats,  Fevers, chills, fatigue, or  lassitude.  HEENT:   No headaches,  Difficulty swallowing,  Tooth/dental problems, or  Sore throat,                No sneezing, itching, ear ache, nasal congestion, post nasal drip,   CV:  No chest pain,  Orthopnea, PND, swelling in lower extremities, anasarca, dizziness, palpitations, syncope.   GI  No heartburn, indigestion, abdominal pain, nausea, vomiting, diarrhea, change in bowel habits, loss of appetite, bloody stools.   Resp: No shortness of breath with exertion or at rest.  No excess mucus, no productive cough,  No non-productive cough,  No coughing up of blood.  No change in color of mucus.  No wheezing.  No  chest wall deformity  Skin: no rash or lesions.  GU: no dysuria, change in color of urine, no urgency or frequency.  No flank pain, no hematuria   MS:  No joint pain or swelling.  No decreased range of motion.  No back pain.    Physical Exam  BP 110/78 (BP Location: Left Arm, Cuff Size: Normal)   Pulse 97   Temp (!) 97.3 F (36.3 C) (Temporal)   Ht 5\' 5"  (1.651 m)   Wt 181 lb (82.1 kg)   SpO2 98% Comment: RA  BMI 30.12 kg/m   GEN: A/Ox3; pleasant , NAD, well nourished    HEENT:  [place]/AT,    NOSE-clear, THROAT-clear, no lesions, no postnasal drip or exudate noted.   NECK:  Supple w/ fair ROM; no JVD; normal carotid impulses w/o bruits; no thyromegaly or nodules palpated; no lymphadenopathy.    RESP  Clear  P & A; w/o, wheezes/ rales/ or rhonchi. no accessory muscle use, no dullness to percussion  CARD:  RRR, no m/r/g, no peripheral edema, pulses intact, no cyanosis or clubbing.  GI:   Soft & nt; nml bowel sounds; no organomegaly or masses detected.   Musco: Warm bil, no deformities or joint swelling noted.   Neuro: alert, no focal deficits noted.    Skin: Warm, no lesions or rashes    Lab Results:   BNP No results found for: BNP  ProBNP No results found for: PROBNP  Imaging: No results found.    No flowsheet data found.  No results found for: NITRICOXIDE      Assessment & Plan:   OSA (obstructive sleep apnea) Excellent control and compliance over the last 30 days on CPAP.  Patient has significant perceived benefit since starting back on CPAP.  Plan  Patient Instructions  Continue on CPAP At bedtime  .  Goal is to wear for at least 4-6 hrs each night .  Do not drive if sleepy.  Use saline nasal rinses and nasal gel As needed   Work on healthy weight .  Flu shot .  Follow up  In 1 year with Dr.  Or Maize Brittingham NP and As needed       Morbid obesity (HCC) Healthy weight loss discussed     Vassie Loll, NP 11/12/2019

## 2019-11-12 NOTE — Assessment & Plan Note (Signed)
Excellent control and compliance over the last 30 days on CPAP.  Patient has significant perceived benefit since starting back on CPAP.  Plan  Patient Instructions  Continue on CPAP At bedtime  .  Goal is to wear for at least 4-6 hrs each night .  Do not drive if sleepy.  Use saline nasal rinses and nasal gel As needed   Work on healthy weight .  Flu shot .  Follow up  In 1 year with Dr. Vassie Loll  Or Delesa Kawa NP and As needed

## 2019-12-16 ENCOUNTER — Telehealth: Payer: Self-pay | Admitting: Family Medicine

## 2019-12-16 DIAGNOSIS — M48061 Spinal stenosis, lumbar region without neurogenic claudication: Secondary | ICD-10-CM

## 2019-12-16 MED ORDER — DIAZEPAM 5 MG PO TABS: ORAL_TABLET | ORAL | 0 refills | Status: DC

## 2019-12-16 NOTE — Telephone Encounter (Signed)
Patient called states back in May 2021 Dr. Jordan Likes ordered :  epidural   ---- Per patient back pain has returned & wonders if a OV is necessary or can provider just call her in another (Epidural to [facility]  Forwarding request to Dr. Jordan Likes.  --glh

## 2019-12-23 ENCOUNTER — Ambulatory Visit
Admission: RE | Admit: 2019-12-23 | Discharge: 2019-12-23 | Disposition: A | Discharge status: Home / Self Care | Payer: Medicaid Other | Source: Ambulatory Visit | Attending: Family Medicine | Admitting: Family Medicine

## 2019-12-23 DIAGNOSIS — M48061 Spinal stenosis, lumbar region without neurogenic claudication: Secondary | ICD-10-CM

## 2019-12-23 MED ORDER — IOPAMIDOL (ISOVUE-M 200) INJECTION 41%
1.0000 mL | Freq: Once | INTRAMUSCULAR | Status: AC
  Administered 2019-12-23: 1 mL via EPIDURAL

## 2019-12-23 MED ORDER — METHYLPREDNISOLONE ACETATE 40 MG/ML INJ SUSP (RADIOLOG
120.0000 mg | Freq: Once | INTRAMUSCULAR | Status: AC
  Administered 2019-12-23: 120 mg via EPIDURAL

## 2019-12-23 NOTE — Discharge Instructions (Signed)

## 2020-01-20 ENCOUNTER — Ambulatory Visit (INDEPENDENT_AMBULATORY_CARE_PROVIDER_SITE_OTHER): Payer: Medicaid Other | Admitting: Family Medicine

## 2020-01-20 ENCOUNTER — Other Ambulatory Visit: Payer: Self-pay

## 2020-01-20 ENCOUNTER — Encounter: Payer: Self-pay | Admitting: Family Medicine

## 2020-01-20 VITALS — BP 139/84 | HR 103 | Ht 65.0 in | Wt 175.0 lb

## 2020-01-20 DIAGNOSIS — M48062 Spinal stenosis, lumbar region with neurogenic claudication: Secondary | ICD-10-CM

## 2020-01-20 MED ORDER — HYDROCODONE-ACETAMINOPHEN 5-325 MG PO TABS: 1.0000 | ORAL_TABLET | Freq: Three times a day (TID) | ORAL | 0 refills | Status: DC | PRN

## 2020-01-20 NOTE — Assessment & Plan Note (Signed)
Symptoms seem to be more associated with worsening of the stenosis.  Did have episodes of reported incontinence. -Counseled on home exercise therapy and supportive care. -Norco. -Referral to neurosurgery. -Counseled on need to seek immediate care.

## 2020-01-20 NOTE — Patient Instructions (Signed)
Good to see you Please use the norco as needed  Neurosurgery should give you a call.   Please send me a message in MyChart with any questions or updates.  Please see [place] back as needed.   --Dr. Jordan Likes

## 2020-01-20 NOTE — Progress Notes (Signed)
Vanessa Kick Rashauna Tep - 54 y.o. female MRN [id number]  Date of birth: 1965-10-28  SUBJECTIVE:  Including CC & ROS.  Chief Complaint  Patient presents with  . Back Pain    low    Vanessa Kick Shakyia Bosso is a 54 y.o. female that is presenting with worsening of her leg pain and low back pain.  She did not get improvement with the second epidural injection.  She reports an episode incontinence as well.  She is also having altered sensation on left leg randomly.   Review of Systems See HPI   HISTORY: Past Medical, Surgical, Social, and Family History Reviewed & Updated per EMR.   Pertinent Historical Findings include:  Past Medical History:  Diagnosis Date  . Anxiety   . Arthritis    neck  . Attention deficit disorder   . Dyspareunia   . Dysrhythmia    currently being seen by Dr Sanjuana Kava  [phone]  . Headache(784.0)    due to neck - otc meds prn  . Herpes simplex type 1 infection   . PONV (postoperative nausea and vomiting)   . Seasonal allergies   . SVD (spontaneous vaginal delivery)    x 2  . Urinary incontinence     Past Surgical History:  Procedure Laterality Date  . ABDOMINAL HYSTERECTOMY  2001   [facility]-Dr. Miguel Aschoff  . ANTERIOR CERVICAL DECOMPRESSION/DISCECTOMY FUSION 4 LEVEL/HARDWARE REMOVAL N/A 11/16/2012   Procedure: Cervical Three-Four, Cervical Four-Five, Cervical Six-Seven, Cervical Seven-Thoracic One Anterior cervical decompression/diskectomy/fusion/possible removal of plate at J2-8;  Surgeon: Karn Cassis, MD;  Location: [facility];  Service: Neurosurgery;  Laterality: N/A;  ANTERIOR CERVICAL DECOMPRESSION/DISCECTOMY FUSION 4 LEVEL/HARDWARE REMOVAL  . AUGMENTATION MAMMAPLASTY  2002  . BREAST SURGERY    . CERVICAL FUSION     c3-4  . CYSTOSCOPY N/A 01/29/2013   Procedure: CYSTOSCOPY;  Surgeon: Martina Sinner, MD;  Location: [facility];  Service: Urology;  Laterality: N/A;  . INCONTINENCE SURGERY  03/2002   SPARC sling--[facility] Di Kindle in  [facility]  .  PUBOVAGINAL SLING N/A 01/29/2013   Procedure: REMOVAL OF SLING ; INSERTION OF XEROFORM GRAFT;  Surgeon: Martina Sinner, MD;  Location: [facility];  Service: Urology;  Laterality: N/A;  . WISDOM TOOTH EXTRACTION      Family History  Problem Relation Age of Onset  . Depression Mother   . Asthma Mother   . Diabetes Father   . Hypertension Father   . Heart disease Father   . Kidney disease Father   . Heart failure Father   . Breast cancer Maternal Grandmother   . Hypertension Maternal Grandmother   . Hypertension Maternal Grandfather   . Hypertension Paternal Grandfather   . Stroke Paternal Grandfather   . CAD Paternal Uncle     Social History   Socioeconomic History  . Marital status: Divorced    Spouse name: Not on file  . Number of children: Not on file  . Years of education: Not on file  . Highest education level: Not on file  Occupational History  . Not on file  Tobacco Use  . Smoking status: Never Smoker  . Smokeless tobacco: Never Used  Vaping Use  . Vaping Use: Never used  Substance and Sexual Activity  . Alcohol use: Yes    Alcohol/week: 0.0 standard drinks    Comment: socially  . Drug use: No  . Sexual activity: Yes    Partners: Male    Birth control/protection: Surgical  Comment: [facility] 2001  Other Topics Concern  . Not on file  Social History Narrative  . Not on file   Social Determinants of Health   Financial Resource Strain:   . Difficulty of Paying Living Expenses: Not on file  Food Insecurity:   . Worried About Programme researcher, broadcasting/film/video in the Last Year: Not on file  . Ran Out of Food in the Last Year: Not on file  Transportation Needs:   . Lack of Transportation (Medical): Not on file  . Lack of Transportation (Non-Medical): Not on file  Physical Activity:   . Days of Exercise per Week: Not on file  . Minutes of Exercise per Session: Not on file  Stress:   . Feeling of Stress : Not on file  Social Connections:   . Frequency of Communication with  Friends and Family: Not on file  . Frequency of Social Gatherings with Friends and Family: Not on file  . Attends Religious Services: Not on file  . Active Member of Clubs or Organizations: Not on file  . Attends Banker Meetings: Not on file  . Marital Status: Not on file  Intimate Partner Violence:   . Fear of Current or Ex-Partner: Not on file  . Emotionally Abused: Not on file  . Physically Abused: Not on file  . Sexually Abused: Not on file     PHYSICAL EXAM:  VS: BP 139/84   Pulse (!) 103   Ht 5\' 5"  (1.651 m)   Wt 175 lb (79.4 kg)   BMI 29.12 kg/m  Physical Exam Gen: NAD, alert, cooperative with exam, well-appearing MSK:  Back: Normal range of motion. Normal strength resistance. Normal gait. Negative straight leg raise. Neurovascularly intact     ASSESSMENT & PLAN:   Spinal stenosis of lumbar region with neurogenic claudication Symptoms seem to be more associated with worsening of the stenosis.  Did have episodes of reported incontinence. -Counseled on home exercise therapy and supportive care. -Norco. -Referral to neurosurgery. -Counseled on need to seek immediate care.

## 2020-02-06 ENCOUNTER — Encounter (HOSPITAL_COMMUNITY): Payer: Self-pay

## 2020-02-06 ENCOUNTER — Inpatient Hospital Stay (HOSPITAL_COMMUNITY): Admission: RE | Admit: 2020-02-06 | Payer: Medicaid Other | Source: Ambulatory Visit

## 2020-02-06 ENCOUNTER — Other Ambulatory Visit: Payer: Self-pay | Admitting: Neurological Surgery

## 2020-02-06 ENCOUNTER — Other Ambulatory Visit: Payer: Self-pay

## 2020-02-06 ENCOUNTER — Encounter (HOSPITAL_COMMUNITY)
Admission: RE | Admit: 2020-02-06 | Discharge: 2020-02-06 | Disposition: A | Discharge status: Home / Self Care | Payer: Medicaid Other | Source: Ambulatory Visit | Attending: Neurological Surgery | Admitting: Neurological Surgery

## 2020-02-06 DIAGNOSIS — I10 Essential (primary) hypertension: Secondary | ICD-10-CM | POA: Insufficient documentation

## 2020-02-06 DIAGNOSIS — Z01818 Encounter for other preprocedural examination: Secondary | ICD-10-CM | POA: Insufficient documentation

## 2020-02-06 HISTORY — DX: Sleep apnea, unspecified: G47.30

## 2020-02-06 HISTORY — DX: Gastro-esophageal reflux disease without esophagitis: K21.9

## 2020-02-06 LAB — BASIC METABOLIC PANEL
Anion gap: 12 (ref 5–15)
BUN: 11 mg/dL (ref 6–20)
CO2: 26 mmol/L (ref 22–32)
Calcium: 9.3 mg/dL (ref 8.9–10.3)
Chloride: 101 mmol/L (ref 98–111)
Creatinine, Ser: 0.87 mg/dL (ref 0.44–1.00)
GFR, Estimated: >60 mL/min (ref >60)
Glucose, Bld: 89 mg/dL (ref 70–99)
Potassium: 4.2 mmol/L (ref 3.5–5.1)
Sodium: 139 mmol/L (ref 135–145)

## 2020-02-06 LAB — CBC
HCT: 45.5 % (ref 36.0–46.0)
Hemoglobin: 14.6 g/dL (ref 12.0–15.0)
MCH: 30.8 pg (ref 26.0–34.0)
MCHC: 32.1 g/dL (ref 30.0–36.0)
MCV: 96 fL (ref 80.0–100.0)
Platelets: 343 10*3/uL (ref 150–400)
RBC: 4.74 MIL/uL (ref 3.87–5.11)
RDW: 12.5 % (ref 11.5–15.5)
WBC: 8.6 10*3/uL (ref 4.0–10.5)
nRBC: 0 % (ref 0.0–0.2)

## 2020-02-06 LAB — SURGICAL PCR SCREEN
MRSA, PCR: NEGATIVE
Staphylococcus aureus: POSITIVE — AB
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
## 2020-02-06 NOTE — Pre-Procedure Instructions (Signed)
Shawnae Leiva  02/06/2020     Your procedure is scheduled on Monday, December 13.  Report to [facility], Main Entrance or Entrance "A" at 5:30 AM .                Your surgery or procedure is scheduled to begin at 7:30 AM   Call this number if you have problems the morning of surgery: [phone]  This is the number for the Pre- Surgical Desk.                For any other questions, please call [phone], Monday - Friday 8 AM - 4 PM.   Remember:  Do not eat or drink after midnight.   Take these medicines the morning of surgery with A SIP OF WATER : buPROPion (WELLBUTRIN XL)  gabapentin (NEURONTIN)  sertraline (ZOLOFT)    Take if needed: HYDROcodone-acetaminophen (NORCO/VICODIN)   STOP taking Aspirin, Aspirin Products (Goody Powder, Excedrin Migraine), Ibuprofen (Advil), Naproxen (Aleve), Vitamins and Herbal Products (ie Fish Oil).    Special instructions:    Bring your CPAP mask with you to the hospital.  [facility]- Preparing For Surgery  Before surgery, you can play an important role. Because skin is not sterile, your skin needs to be as free of germs as possible. You can reduce the number of germs on your skin by washing with CHG (chlorahexidine gluconate) Soap before surgery.  CHG is an antiseptic cleaner which kills germs and bonds with the skin to continue killing germs even after washing.    Oral Hygiene is also important to reduce your risk of infection.  Remember - BRUSH YOUR TEETH THE MORNING OF SURGERY WITH YOUR REGULAR TOOTHPASTE  Please do not use if you have an allergy to CHG or antibacterial soaps. If your skin becomes reddened/irritated stop using the CHG.  Do not shave (including legs and underarms) for at least 48 hours prior to first CHG shower. It is OK to shave your face.  Please follow these instructions carefully.   1. Shower the NIGHT BEFORE SURGERY and the MORNING OF SURGERY with CHG.   2. If you chose to wash your hair, wash  your hair first as usual with your normal shampoo.  3. After you shampoo, wash your face and private area with the soap you use at home, then rinse your hair and body thoroughly to remove the shampoo and soap.  4. Use CHG as you would any other liquid soap. You can apply CHG directly to the skin and wash gently with a scrungie or a clean washcloth.   5. Apply the CHG Soap to your body ONLY FROM THE NECK DOWN.  Do not use on open wounds or open sores. Avoid contact with your eyes, ears, mouth and genitals (private parts).   6. Wash thoroughly, paying special attention to the area where your surgery will be performed.  7. Thoroughly rinse your body with warm water from the neck down.  8. DO NOT shower/wash with your normal soap after using and rinsing off the CHG Soap.  9. Pat yourself dry with a CLEAN TOWEL.  10. Wear CLEAN PAJAMAS to bed the night before surgery, wear comfortable clothes the morning of surgery  11. Place CLEAN SHEETS on your bed the night of your first shower and DO NOT SLEEP WITH PETS.  Day of Surgery: Shower as instructed above. Do not apply any deodorants/lotions, powders or colognes.  Please wear clean clothes to the  hospital/surgery center.   Remember to brush your teeth WITH YOUR REGULAR TOOTHPASTE.  Do not wear jewelry, make-up or nail polish.  Do not shave 48 hours prior to surgery.  Men may shave face and neck.  Do not bring valuables to the hospital.  [facility] is not responsible for any belongings or valuables.  Contacts, dentures or bridgework may not be worn into surgery.  Leave your suitcase in the car.  After surgery it may be brought to your room.  For patients admitted to the hospital, discharge time will be determined by your treatment team.  Patients discharged the day of surgery will not be allowed to drive home.   Please read over the fact sheets that you were given.

## 2020-02-07 ENCOUNTER — Other Ambulatory Visit (HOSPITAL_COMMUNITY)
Admission: RE | Admit: 2020-02-07 | Discharge: 2020-02-07 | Disposition: A | Discharge status: Home / Self Care | Payer: Medicaid Other | Source: Ambulatory Visit | Attending: Neurological Surgery | Admitting: Neurological Surgery

## 2020-02-07 DIAGNOSIS — Z01812 Encounter for preprocedural laboratory examination: Secondary | ICD-10-CM | POA: Diagnosis not present

## 2020-02-07 DIAGNOSIS — Z20822 Contact with and (suspected) exposure to covid-19: Secondary | ICD-10-CM | POA: Insufficient documentation

## 2020-02-07 LAB — SARS CORONAVIRUS 2 (TAT 6-24 HRS): SARS Coronavirus 2: NEGATIVE

## 2020-02-09 NOTE — Anesthesia Preprocedure Evaluation (Addendum)
Anesthesia Evaluation  Patient identified by MRN, date of birth, ID band Patient awake    Reviewed: Allergy & Precautions, NPO status , Patient's Chart, lab work & pertinent test results  History of Anesthesia Complications (+) PONV and history of anesthetic complications  Airway Mallampati: I  TM Distance: >3 FB Neck ROM: Full    Dental  (+) Teeth Intact   Pulmonary asthma , sleep apnea and Continuous Positive Airway Pressure Ventilation ,    Pulmonary exam normal breath sounds clear to auscultation       Cardiovascular negative cardio ROS Normal cardiovascular exam Rhythm:Regular Rate:Normal     Neuro/Psych  Headaches, PSYCHIATRIC DISORDERS Anxiety Depression Lumbar stenosis     GI/Hepatic Neg liver ROS, GERD  ,  Endo/Other  Obesity   Renal/GU negative Renal ROS Bladder dysfunction      Musculoskeletal  (+) Arthritis ,   Abdominal   Peds  (+) ATTENTION DEFICIT DISORDER WITHOUT HYPERACTIVITY Hematology negative hematology ROS (+)   Anesthesia Other Findings   Reproductive/Obstetrics                            Anesthesia Physical Anesthesia Plan  ASA: II  Anesthesia Plan: General   Post-op Pain Management:    Induction: Intravenous  PONV Risk Score and Plan: 4 or greater and Midazolam, Dexamethasone, Ondansetron, TIVA and Diphenhydramine  Airway Management Planned: Oral ETT  Additional Equipment:   Intra-op Plan:   Post-operative Plan: Extubation in OR  Informed Consent: I have reviewed the patients History and Physical, chart, labs and discussed the procedure including the risks, benefits and alternatives for the proposed anesthesia with the patient or authorized representative who has indicated his/her understanding and acceptance.       Plan Discussed with: CRNA  Anesthesia Plan Comments: (2nd PIV)       Anesthesia Quick Evaluation

## 2020-02-10 ENCOUNTER — Other Ambulatory Visit: Payer: Self-pay

## 2020-02-10 ENCOUNTER — Ambulatory Visit (HOSPITAL_COMMUNITY)
Admission: RE | Admit: 2020-02-10 | Discharge: 2020-02-10 | Disposition: A | Discharge status: Home / Self Care | Payer: Medicaid Other | Attending: Neurological Surgery | Admitting: Neurological Surgery

## 2020-02-10 ENCOUNTER — Ambulatory Visit (HOSPITAL_COMMUNITY): Payer: Medicaid Other | Admitting: Physician Assistant

## 2020-02-10 ENCOUNTER — Encounter (HOSPITAL_COMMUNITY): Payer: Self-pay | Admitting: Neurological Surgery

## 2020-02-10 ENCOUNTER — Ambulatory Visit (HOSPITAL_COMMUNITY): Payer: Medicaid Other

## 2020-02-10 ENCOUNTER — Ambulatory Visit (HOSPITAL_COMMUNITY): Payer: Medicaid Other | Admitting: Anesthesiology

## 2020-02-10 ENCOUNTER — Encounter (HOSPITAL_COMMUNITY)
Admission: RE | Disposition: A | Discharge status: Home / Self Care | Payer: Self-pay | Source: Home / Self Care | Attending: Neurological Surgery

## 2020-02-10 DIAGNOSIS — Z419 Encounter for procedure for purposes other than remedying health state, unspecified: Secondary | ICD-10-CM

## 2020-02-10 DIAGNOSIS — M48062 Spinal stenosis, lumbar region with neurogenic claudication: Secondary | ICD-10-CM | POA: Insufficient documentation

## 2020-02-10 HISTORY — PX: LUMBAR LAMINECTOMY/ DECOMPRESSION WITH MET-RX: SHX5959

## 2020-02-10 SURGERY — LUMBAR LAMINECTOMY/ DECOMPRESSION WITH MET-RX
Anesthesia: General | Site: Spine Lumbar

## 2020-02-10 MED ORDER — PROPOFOL 500 MG/50ML IV EMUL
INTRAVENOUS | Status: DC | PRN
Start: 2020-02-10 — End: 2020-02-10
  Administered 2020-02-10: 100 ug/kg/min via INTRAVENOUS

## 2020-02-10 MED ORDER — SODIUM CHLORIDE (PF) 0.9 % IJ SOLN
INTRAMUSCULAR | Status: AC
  Filled 2020-02-10: qty 10

## 2020-02-10 MED ORDER — SUGAMMADEX SODIUM 200 MG/2ML IV SOLN
INTRAVENOUS | Status: DC | PRN
  Administered 2020-02-10: 200 mg via INTRAVENOUS

## 2020-02-10 MED ORDER — DIPHENHYDRAMINE HCL 50 MG/ML IJ SOLN
INTRAMUSCULAR | Status: AC
  Filled 2020-02-10: qty 1

## 2020-02-10 MED ORDER — ONDANSETRON HCL 4 MG/2ML IJ SOLN
INTRAMUSCULAR | Status: AC
  Filled 2020-02-10: qty 2

## 2020-02-10 MED ORDER — LACTATED RINGERS IV SOLN: INTRAVENOUS | Status: DC | PRN

## 2020-02-10 MED ORDER — MIDAZOLAM HCL 2 MG/2ML IJ SOLN
INTRAMUSCULAR | Status: AC
  Filled 2020-02-10: qty 2

## 2020-02-10 MED ORDER — SCOPOLAMINE 1 MG/3DAYS TD PT72: 1.0000 | MEDICATED_PATCH | Freq: Once | TRANSDERMAL | Status: DC

## 2020-02-10 MED ORDER — ACETAMINOPHEN 500 MG PO TABS
ORAL_TABLET | ORAL | Status: AC
  Administered 2020-02-10: 07:00:00 1000 mg via ORAL
  Filled 2020-02-10: qty 2

## 2020-02-10 MED ORDER — SUFENTANIL CITRATE 50 MCG/ML IV SOLN
INTRAVENOUS | Status: DC | PRN
  Administered 2020-02-10 (×2): 5 ug via INTRAVENOUS
  Administered 2020-02-10: 20 ug via INTRAVENOUS
  Administered 2020-02-10 (×2): 10 ug via INTRAVENOUS

## 2020-02-10 MED ORDER — LIDOCAINE HCL (CARDIAC) PF 100 MG/5ML IV SOSY
PREFILLED_SYRINGE | INTRAVENOUS | Status: DC | PRN
  Administered 2020-02-10: 100 mg via INTRAVENOUS

## 2020-02-10 MED ORDER — PROPOFOL 10 MG/ML IV BOLUS
INTRAVENOUS | Status: DC | PRN
  Administered 2020-02-10: 170 mg via INTRAVENOUS
  Administered 2020-02-10 (×2): 30 mg via INTRAVENOUS

## 2020-02-10 MED ORDER — LIDOCAINE HCL (PF) 2 % IJ SOLN
INTRAMUSCULAR | Status: AC
  Filled 2020-02-10: qty 5

## 2020-02-10 MED ORDER — THROMBIN 5000 UNITS EX SOLR
CUTANEOUS | Status: AC
  Filled 2020-02-10: qty 5000

## 2020-02-10 MED ORDER — CEFAZOLIN SODIUM-DEXTROSE 2-4 GM/100ML-% IV SOLN
2.0000 g | INTRAVENOUS | Status: AC
  Administered 2020-02-10: 08:00:00 2 g via INTRAVENOUS

## 2020-02-10 MED ORDER — OXYCODONE-ACETAMINOPHEN 5-325 MG PO TABS: 1.0000 | ORAL_TABLET | ORAL | 0 refills | Status: AC | PRN

## 2020-02-10 MED ORDER — SCOPOLAMINE 1 MG/3DAYS TD PT72
MEDICATED_PATCH | TRANSDERMAL | Status: AC
  Filled 2020-02-10: qty 1

## 2020-02-10 MED ORDER — DEXAMETHASONE SODIUM PHOSPHATE 10 MG/ML IJ SOLN
INTRAMUSCULAR | Status: AC
  Filled 2020-02-10: qty 1

## 2020-02-10 MED ORDER — CHLORHEXIDINE GLUCONATE CLOTH 2 % EX PADS: 6.0000 | MEDICATED_PAD | Freq: Once | CUTANEOUS | Status: DC

## 2020-02-10 MED ORDER — LACTATED RINGERS IV SOLN: INTRAVENOUS | Status: DC

## 2020-02-10 MED ORDER — LIDOCAINE-EPINEPHRINE 1 %-1:100000 IJ SOLN
INTRAMUSCULAR | Status: DC | PRN
  Administered 2020-02-10: 10 mL

## 2020-02-10 MED ORDER — MIDAZOLAM HCL 2 MG/2ML IJ SOLN
INTRAMUSCULAR | Status: DC | PRN
  Administered 2020-02-10 (×2): 2 mg via INTRAVENOUS

## 2020-02-10 MED ORDER — PROPOFOL 10 MG/ML IV BOLUS
INTRAVENOUS | Status: AC
  Filled 2020-02-10: qty 20

## 2020-02-10 MED ORDER — SUFENTANIL CITRATE 50 MCG/ML IV SOLN
INTRAVENOUS | Status: AC
  Filled 2020-02-10: qty 1

## 2020-02-10 MED ORDER — ONDANSETRON HCL 4 MG/2ML IJ SOLN
INTRAMUSCULAR | Status: DC | PRN
  Administered 2020-02-10: 4 mg via INTRAVENOUS

## 2020-02-10 MED ORDER — ROCURONIUM BROMIDE 10 MG/ML (PF) SYRINGE
PREFILLED_SYRINGE | INTRAVENOUS | Status: AC
  Filled 2020-02-10: qty 10

## 2020-02-10 MED ORDER — LIDOCAINE-EPINEPHRINE 1 %-1:100000 IJ SOLN
INTRAMUSCULAR | Status: AC
  Filled 2020-02-10: qty 1

## 2020-02-10 MED ORDER — DIPHENHYDRAMINE HCL 50 MG/ML IJ SOLN
INTRAMUSCULAR | Status: DC | PRN
  Administered 2020-02-10: 25 mg via INTRAVENOUS

## 2020-02-10 MED ORDER — ROCURONIUM 10MG/ML (10ML) SYRINGE FOR MEDFUSION PUMP - OPTIME
INTRAVENOUS | Status: DC | PRN
  Administered 2020-02-10: 60 mg via INTRAVENOUS

## 2020-02-10 MED ORDER — FENTANYL CITRATE (PF) 100 MCG/2ML IJ SOLN: 25.0000 ug | INTRAMUSCULAR | Status: DC | PRN

## 2020-02-10 MED ORDER — PROMETHAZINE HCL 25 MG/ML IJ SOLN
6.2500 mg | INTRAMUSCULAR | Status: DC | PRN
Start: 2020-02-10 — End: 2020-02-10

## 2020-02-10 MED ORDER — ACETAMINOPHEN 500 MG PO TABS: 1000.0000 mg | ORAL_TABLET | Freq: Once | ORAL | Status: AC

## 2020-02-10 MED ORDER — 0.9 % SODIUM CHLORIDE (POUR BTL) OPTIME
TOPICAL | Status: DC | PRN
  Administered 2020-02-10: 09:00:00 1000 mL

## 2020-02-10 MED ORDER — ORAL CARE MOUTH RINSE: 15.0000 mL | Freq: Once | OROMUCOSAL | Status: AC

## 2020-02-10 MED ORDER — HEMOSTATIC AGENTS (NO CHARGE) OPTIME
TOPICAL | Status: DC | PRN
  Administered 2020-02-10: 1 via TOPICAL

## 2020-02-10 MED ORDER — DEXAMETHASONE SODIUM PHOSPHATE 10 MG/ML IJ SOLN
INTRAMUSCULAR | Status: DC | PRN
  Administered 2020-02-10: 10 mg via INTRAVENOUS

## 2020-02-10 MED ORDER — CEFAZOLIN SODIUM-DEXTROSE 2-4 GM/100ML-% IV SOLN
INTRAVENOUS | Status: AC
  Filled 2020-02-10: qty 100

## 2020-02-10 MED ORDER — CHLORHEXIDINE GLUCONATE 0.12 % MT SOLN
15.0000 mL | Freq: Once | OROMUCOSAL | Status: AC
  Administered 2020-02-10: 07:00:00 15 mL via OROMUCOSAL
  Filled 2020-02-10: qty 15

## 2020-02-10 SURGICAL SUPPLY — 59 items
ADH SKN CLS APL DERMABOND .7 (GAUZE/BANDAGES/DRESSINGS) ×1
BAND INSRT 18 STRL LF DISP RB (MISCELLANEOUS) ×2
BAND RUBBER #18 3X1/16 STRL (MISCELLANEOUS) ×6 IMPLANT
BLADE CLIPPER SURG (BLADE) IMPLANT
BLADE SURG 11 STRL SS (BLADE) ×3 IMPLANT
BUR PRECISION FLUTE 5.0 (BURR) IMPLANT
BUR PRECISION MATCH 3.0 13 (BURR) ×2 IMPLANT
BUR PRECISION MATCH 3.0 13CM (BURR) ×1
CANISTER SUCT 3000ML PPV (MISCELLANEOUS) ×3 IMPLANT
COVER WAND RF STERILE (DRAPES) ×1 IMPLANT
DECANTER SPIKE VIAL GLASS SM (MISCELLANEOUS) ×3 IMPLANT
DERMABOND ADVANCED (GAUZE/BANDAGES/DRESSINGS) ×2
DERMABOND ADVANCED .7 DNX12 (GAUZE/BANDAGES/DRESSINGS) ×1 IMPLANT
DRAPE C-ARM 42X72 X-RAY (DRAPES) ×6 IMPLANT
DRAPE LAPAROTOMY 100X72X124 (DRAPES) ×3 IMPLANT
DRAPE MICROSCOPE LEICA (MISCELLANEOUS) ×3 IMPLANT
DRAPE SURG 17X23 STRL (DRAPES) ×1 IMPLANT
DURAPREP 26ML APPLICATOR (WOUND CARE) ×3 IMPLANT
ELECT BLADE 6.5 EXT (BLADE) ×3 IMPLANT
ELECT REM PT RETURN 9FT ADLT (ELECTROSURGICAL) ×3
ELECTRODE REM PT RTRN 9FT ADLT (ELECTROSURGICAL) ×1 IMPLANT
GAUZE 4X4 16PLY RFD (DISPOSABLE) IMPLANT
GAUZE SPONGE 4X4 12PLY STRL (GAUZE/BANDAGES/DRESSINGS) IMPLANT
GLOVE BIO SURGEON STRL SZ 6.5 (GLOVE) ×1 IMPLANT
GLOVE BIO SURGEON STRL SZ7.5 (GLOVE) ×1 IMPLANT
GLOVE BIO SURGEONS STRL SZ 6.5 (GLOVE)
GLOVE BIOGEL PI IND STRL 6.5 (GLOVE) ×1 IMPLANT
GLOVE BIOGEL PI IND STRL 7.5 (GLOVE) ×1 IMPLANT
GLOVE BIOGEL PI INDICATOR 6.5 (GLOVE)
GLOVE BIOGEL PI INDICATOR 7.5 (GLOVE) ×2
GLOVE EXAM NITRILE LRG STRL (GLOVE) IMPLANT
GLOVE EXAM NITRILE XL STR (GLOVE) IMPLANT
GLOVE EXAM NITRILE XS STR PU (GLOVE) IMPLANT
GLOVE SS PI 9.0 STRL (GLOVE) ×2 IMPLANT
GLOVE SURG SS PI 6.0 STRL IVOR (GLOVE) ×2 IMPLANT
GLOVE SURG SS PI 7.5 STRL IVOR (GLOVE) ×2 IMPLANT
GLOVE SURG UNDER POLY LF SZ6.5 (GLOVE) ×2 IMPLANT
GOWN STRL REUS W/ TWL LRG LVL3 (GOWN DISPOSABLE) ×2 IMPLANT
GOWN STRL REUS W/ TWL XL LVL3 (GOWN DISPOSABLE) IMPLANT
GOWN STRL REUS W/TWL 2XL LVL3 (GOWN DISPOSABLE) IMPLANT
GOWN STRL REUS W/TWL LRG LVL3 (GOWN DISPOSABLE) ×9
GOWN STRL REUS W/TWL XL LVL3 (GOWN DISPOSABLE) ×3
HEMOSTAT POWDER KIT SURGIFOAM (HEMOSTASIS) ×1 IMPLANT
KIT BASIN OR (CUSTOM PROCEDURE TRAY) ×3 IMPLANT
KIT TURNOVER KIT B (KITS) ×3 IMPLANT
NDL SPNL 18GX3.5 QUINCKE PK (NEEDLE) ×1 IMPLANT
NEEDLE HYPO 22GX1.5 SAFETY (NEEDLE) ×3 IMPLANT
NEEDLE SPNL 18GX3.5 QUINCKE PK (NEEDLE) ×3 IMPLANT
NS IRRIG 1000ML POUR BTL (IV SOLUTION) ×3 IMPLANT
PACK LAMINECTOMY NEURO (CUSTOM PROCEDURE TRAY) ×3 IMPLANT
PAD ARMBOARD 7.5X6 YLW CONV (MISCELLANEOUS) ×15 IMPLANT
SPONGE LAP 4X18 RFD (DISPOSABLE) IMPLANT
SUT MNCRL AB 3-0 PS2 18 (SUTURE) ×2 IMPLANT
SUT VIC AB 0 CT1 18XCR BRD8 (SUTURE) IMPLANT
SUT VIC AB 0 CT1 8-18 (SUTURE)
SUT VIC AB 2-0 CP2 18 (SUTURE) ×3 IMPLANT
TOWEL GREEN STERILE (TOWEL DISPOSABLE) ×3 IMPLANT
TOWEL GREEN STERILE FF (TOWEL DISPOSABLE) ×3 IMPLANT
WATER STERILE IRR 1000ML POUR (IV SOLUTION) ×3 IMPLANT

## 2020-02-10 NOTE — Op Note (Signed)
PATIENT: Kaitlyn Carter  DAY OF SURGERY: 02/10/20  PRE-OPERATIVE DIAGNOSIS: Lumbar stenosis with neurogenic claudication  POST-OPERATIVE DIAGNOSIS: Lumbar stenosis with neurogenic claudication  PROCEDURE:Minimally invasive L4-5 laminectomy  SURGEON: Surgeon(s) and Role: Jadene Pierini, MD - Primary    Julio Sicks, MD - Assisting  ANESTHESIA:ETGA  BRIEF HISTORY: This is a 54 year old woman who presented with symptoms of neurogenic claudication. The patient was found to have central canal stenosis worst at L4-5. Her symptoms were unfortunately refractory to non-surgical treatment measures. I therefore recommended a minimally invasive L4-5 laminectomy. This was discussed with the patient as well as risks, benefits, and alternatives and the patient wished to proceed with surgical treatment.  OPERATIVE DETAIL: The patient was taken to the operating room and placed on the OR table in the prone position. A formal time out was performed with two patient identifiers and confirmed the operative site. Anesthesia was induced by the anesthesia team. The operative site was marked, hair was clipped with surgical clippers, the area was then prepped and draped in a sterile fashion. Fluoroscopy was used to identify the surgical level with a spinal needle. A 2cm incision was then marked 1cm off to the right of midline. The fascia was incised sharply and serial dilators were docked to the spinolaminar interface on L4. A final tubular distractor was placed and secured to the table and fluoro again confirmed the correct surgical level. The spinous process, lamina, and facet locations were confirmed for orientation. A high speed drill was then used to perform an L4 laminotomy until the insertion of the ligamentum flavum was identified superiorly. This was then extended to complete the hemilaminotomy by extending medially to the midline and laterally to the lateral insertion of the ligamentum  flavum. The table was tilted and tubular dilator was adjusted to look across midline. An 'over the top' decompression was then performed by dissecting the ligamentum flavum off of the spinous process and lamina and identifying its insertion superiorly on the lamina and laterally at the facet.   A #9 suction was then used to protect the thecal sac while the contralateral lamina was removed. With drilling complete, the ligamentum flavum was then completely resected from its insertion and dura was palpated in all directions to confirm good decompression.   Of note, the ligamentum flavum was severely hypertrophic, thickened, and compressive. The right facet complex felt loose during palpation of landmarks and was easily mobilized with just a suction. I therefore performed an extensive decompression on the left and used curved kerrisons to undercut the right facet to decompress the right lateral recess but tried to minimize my facetectomy to avoid introducing instability. This made it impossible to visualize the right lateral recess fully, so curved kerrisons were used to disconnect the ligamentum flavum and the traversing nerve root was palpated along its course and felt decompressed but could not be visualized without potentially introducing instability.   Hemostasis was obtained, the wound was copiously irrigated, and the tube was removed while using the microscope to confirm hemostasis of the muscle edges. All instrument and sponge counts were correct and the incision was then closed in layers. The patient was then returned to anesthesia for emergence. No apparent complications at the completion of the procedure.  EBL:70mL  DRAINS:none  SPECIMENS: none  Jadene Pierini, MD 02/10/20 7:18 AM

## 2020-02-10 NOTE — Brief Op Note (Addendum)
02/10/2020  10:09 AM  PATIENT:  Kaitlyn Carter  54 y.o. female  PRE-OPERATIVE DIAGNOSIS:  Stenosis  POST-OPERATIVE DIAGNOSIS:  Stenosis  PROCEDURE:  Procedure(s) with comments: Lumbar Four-Five  MIS laminectomy (N/A) - posterior  SURGEON:  Surgeon(s) and Role:    * Antanisha Mohs, Clovis Pu, MD - Primary    * Julio Sicks, MD - Assisting  PHYSICIAN ASSISTANT:   ANESTHESIA:   general  EBL:  40 mL   BLOOD ADMINISTERED:none  DRAINS: none   LOCAL MEDICATIONS USED:  LIDOCAINE   SPECIMEN:  No Specimen  DISPOSITION OF SPECIMEN:  N/A  COUNTS:  YES  TOURNIQUET:  * No tourniquets in log *  DICTATION: .Note written in EPIC  PLAN OF CARE: Admit for overnight observation versus home from PACU  PATIENT DISPOSITION:  PACU - hemodynamically stable.   Delay start of Pharmacological VTE agent (>24hrs) due to surgical blood loss or risk of bleeding: yes

## 2020-02-10 NOTE — H&P (Deleted)
PATIENT: Kaitlyn Carter  DAY OF SURGERY: 02/10/20   PRE-OPERATIVE DIAGNOSIS:  Lumbar stenosis with neurogenic claudication   POST-OPERATIVE DIAGNOSIS:  Lumbar stenosis with neurogenic claudication   PROCEDURE:  Minimally invasive L4-5 laminectomy   SURGEON:  Surgeon(s) and Role:    Jadene Pierini, MD - Primary   ANESTHESIA: ETGA   BRIEF HISTORY: This is a 54 year old woman who presented with symptoms of neurogenic claudication. The patient was found to have central canal stenosis worst at L4-5. Her symptoms were unfortunately refractory to non-surgical treatment measures. I therefore recommended a minimally invasive L4-5 laminectomy. This was discussed with the patient as well as risks, benefits, and alternatives and the patient wished to proceed with surgical treatment.   OPERATIVE DETAIL: The patient was taken to the operating room and placed on the OR table in the prone position. A formal time out was performed with two patient identifiers and confirmed the operative site. Anesthesia was induced by the anesthesia team. The operative site was marked, hair was clipped with surgical clippers, the area was then prepped and draped in a sterile fashion. Fluoroscopy was used to identify the surgical level with a spinal needle. A 2cm incision was then marked 1cm off to the right of midline. The fascia was incised sharply and serial dilators were docked to the spinolaminar interface on L4. A final tubular distractor was placed and secured to the table and fluoro again confirmed the correct surgical level. The spinous process, lamina, and facet locations were confirmed for orientation. A high speed drill was then used to perform an L4 laminotomy until the insertion of the ligamentum flavum was identified superiorly. This was then extended to complete the hemilaminotomy by extending medially to the midline and laterally to the lateral insertion of the ligamentum flavum. The table was tilted and  tubular dilator was adjusted to look across midline. An 'over the top' decompression was then performed by dissecting the ligamentum flavum off of the spinous process and lamina and identifying its insertion superiorly on the lamina and laterally at the facet.   A #9 suction was then used to protect the thecal sac while the contralateral lamina was removed. With drilling complete, the ligamentum flavum was then completely resected from its insertion and dura was palpated in all directions to confirm good decompression.   Of note, the ligamentum flavum was severely hypertrophic, thickened, and compressive. The right facet complex felt loose during palpation of landmarks and was easily mobilized with just a suction. I therefore performed an extensive decompression on the left and used curved kerrisons to undercut the right facet to decompress the right lateral recess but tried to minimize my facetectomy to avoid introducing instability. This made it impossible to visualize the right lateral recess fully, so curved kerrisons were used to disconnect the ligamentum flavum and the traversing nerve root was palpated along its course and felt decompressed but could not be visualized without potentially introducing instability.   Hemostasis was obtained, the wound was copiously irrigated, and the tube was removed while using the microscope to confirm hemostasis of the muscle edges. All instrument and sponge counts were correct and the incision was then closed in layers. The patient was then returned to anesthesia for emergence. No apparent complications at the completion of the procedure.   EBL:  52mL   DRAINS: none   SPECIMENS: none   Jadene Pierini, MD 02/10/20 7:18 AM

## 2020-02-10 NOTE — H&P (Signed)
Surgical H&P Update  HPI: 54 y.o. woman with a history of BLE pain and LBP that has progressed over the past 6 months. Radicular Sx are in a claudication pattern. Her symptoms were refractory to multi-modality non-surgical management, I therefore recommended decompression. No changes in health since she was last seen. Still having symptoms and wishes to proceed with surgery.  PMHx:  Past Medical History:  Diagnosis Date  . Anxiety   . Arthritis    neck  . Attention deficit disorder   . Dyspareunia   . Dysrhythmia    ..this was 17 yrs ago and was seen Dr Clifton James  . GERD (gastroesophageal reflux disease)   . Headache(784.0)    due to neck - otc meds prn  . Herpes simplex type 1 infection   . PONV (postoperative nausea and vomiting)   . Seasonal allergies   . Sleep apnea    cpap  . SVD (spontaneous vaginal delivery)    x 2  . Urinary incontinence    FamHx:  Family History  Problem Relation Age of Onset  . Depression Mother   . Asthma Mother   . Diabetes Father   . Hypertension Father   . Heart disease Father   . Kidney disease Father   . Heart failure Father   . Breast cancer Maternal Grandmother   . Hypertension Maternal Grandmother   . Hypertension Maternal Grandfather   . Hypertension Paternal Grandfather   . Stroke Paternal Grandfather   . CAD Paternal Uncle    SocHx:  reports that she has never smoked. She has never used smokeless tobacco. She reports current alcohol use. She reports that she does not use drugs.  Physical Exam: AOx3, PERRL, FS, TM  Strength 5/5 x4, SILTx4 except numbness of the entire R foot  Assesment/Plan: 54 y.o. woman with lumbar stenosis with neurogenic claudication, here for L4-5 MIS laminectomy. Risks, benefits, and alternatives discussed and the patient would like to continue with surgery.  -OR today -plan for discharge home from PACU post-op  Jadene Pierini, MD 02/10/20 6:52 AM

## 2020-02-10 NOTE — Anesthesia Procedure Notes (Signed)
Procedure Name: Intubation Date/Time: 02/10/2020 7:37 AM Performed by: Claris Che, CRNA Pre-anesthesia Checklist: Patient identified, Emergency Drugs available, Suction available, Patient being monitored and Timeout performed Patient Re-evaluated:Patient Re-evaluated prior to induction Oxygen Delivery Method: Circle system utilized Preoxygenation: Pre-oxygenation with 100% oxygen Induction Type: IV induction and Cricoid Pressure applied Ventilation: Mask ventilation without difficulty Laryngoscope Size: Mac and 4 Grade View: Grade I Tube size: 7.5 mm Number of attempts: 1 Airway Equipment and Method: Stylet Placement Confirmation: ETT inserted through vocal cords under direct vision,  positive ETCO2 and breath sounds checked- equal and bilateral Secured at: 23 cm Tube secured with: Tape Dental Injury: Teeth and Oropharynx as per pre-operative assessment

## 2020-02-10 NOTE — Transfer of Care (Signed)
Immediate Anesthesia Transfer of Care Note  Patient: Kaitlyn Carter  Procedure(s) Performed: Lumbar Four-Five  MIS laminectomy (N/A Spine Lumbar)  Patient Location: PACU  Anesthesia Type:General  Level of Consciousness: oriented, drowsy, patient cooperative and responds to stimulation  Airway & Oxygen Therapy: Patient Spontanous Breathing and Patient connected to nasal cannula oxygen  Post-op Assessment: Report given to RN, Post -op Vital signs reviewed and stable and Patient moving all extremities X 4  Post vital signs: Reviewed and stable  Last Vitals:  Vitals Value Taken Time  BP 140/84 02/10/20 1005  Temp    Pulse 87 02/10/20 1006  Resp 26 02/10/20 1005  SpO2 92 % 02/10/20 1006  Vitals shown include unvalidated device data.  Last Pain:  Vitals:   02/10/20 0641  PainSc: 6       Patients Stated Pain Goal: 3 (02/10/20 0641)  Complications: No complications documented.

## 2020-02-10 NOTE — Anesthesia Postprocedure Evaluation (Signed)
Anesthesia Post Note  Patient: Kaitlyn Carter  Procedure(s) Performed: Lumbar Four-Five  MIS laminectomy (N/A Spine Lumbar)     Patient location during evaluation: PACU Anesthesia Type: General Level of consciousness: awake and alert Pain management: pain level controlled Vital Signs Assessment: post-procedure vital signs reviewed and stable Respiratory status: spontaneous breathing, nonlabored ventilation and respiratory function stable Cardiovascular status: blood pressure returned to baseline and stable Postop Assessment: no apparent nausea or vomiting Anesthetic complications: no   No complications documented.  Last Vitals:  Vitals:   02/10/20 1030 02/10/20 1045  BP: 125/84 129/88  Pulse: 74 87  Resp: 19 18  Temp:  (!) 36.4 C  SpO2: 100% 96%    Last Pain:  Vitals:   02/10/20 1045  PainSc: 0-No pain                 Cecile Hearing

## 2020-02-10 NOTE — OR Nursing (Signed)
Pt ambulatory to wheel chair with steady gait, pt in nad, taken to lobby , placed in car with family.

## 2020-02-10 NOTE — OR Nursing (Signed)
Pt is awake,alert and oriented. Pt is in NAD at this time. Pt and family verbalized understanding of poc and discharge instructions. instructions given to family and reviewed prior to discharge.   

## 2020-02-11 ENCOUNTER — Encounter (HOSPITAL_COMMUNITY): Payer: Self-pay | Admitting: Neurological Surgery

## 2020-02-25 ENCOUNTER — Other Ambulatory Visit: Payer: Self-pay | Admitting: Family Medicine

## 2020-11-12 ENCOUNTER — Ambulatory Visit: Payer: Medicaid Other | Admitting: Adult Health

## 2020-11-18 IMAGING — XA Imaging study
2 series · 2 of 2 positions shown · non-contrast
Comparison: none

CLINICAL DATA: Spondylosis without myelopathy. Good response to
previous epidural injection but with recurrence of symptoms.
Symptoms [REDACTED] more pronounced on the right than the left.

[Series 1: ortho standard · 1 of 1 slices shown (1 of 2)]
[im 1/1]
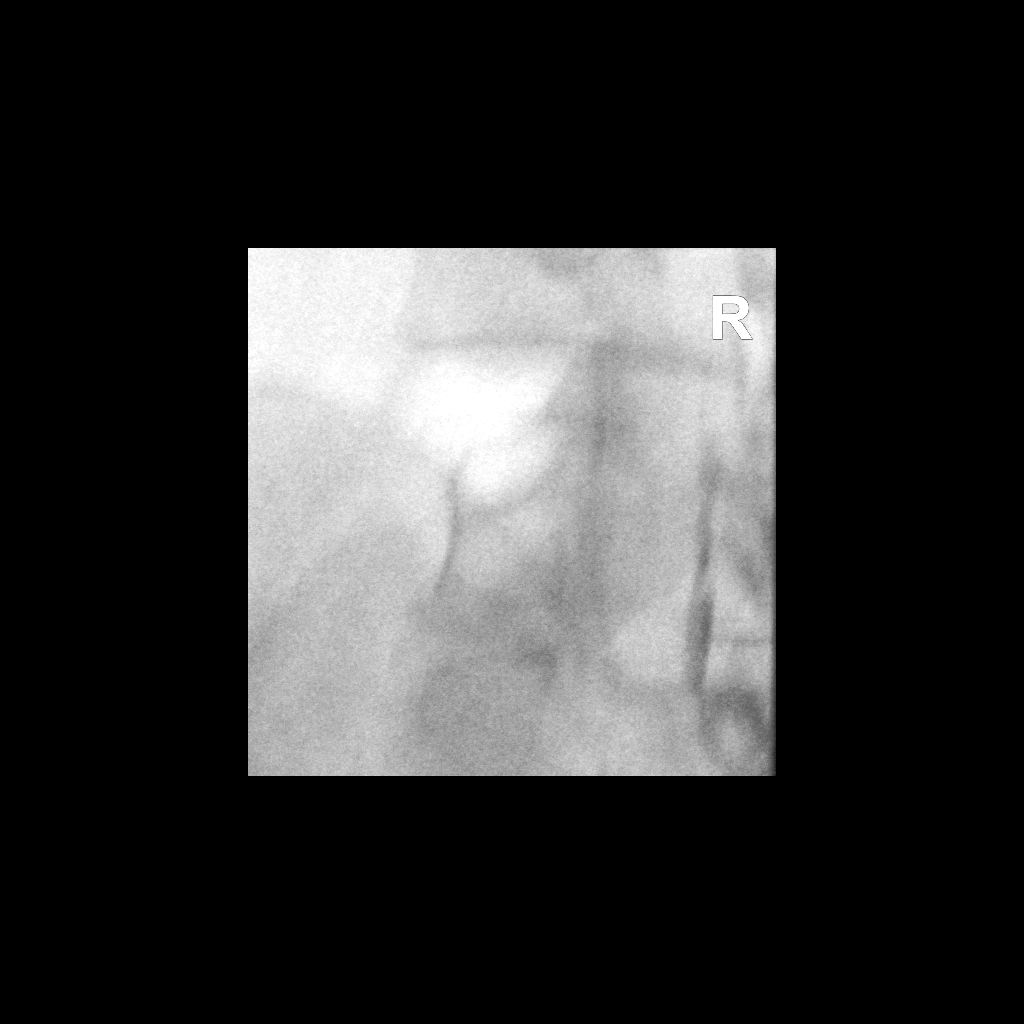

[Series 2: ortho standard · 1 of 1 slices shown (2 of 2)]
[im 1/1]
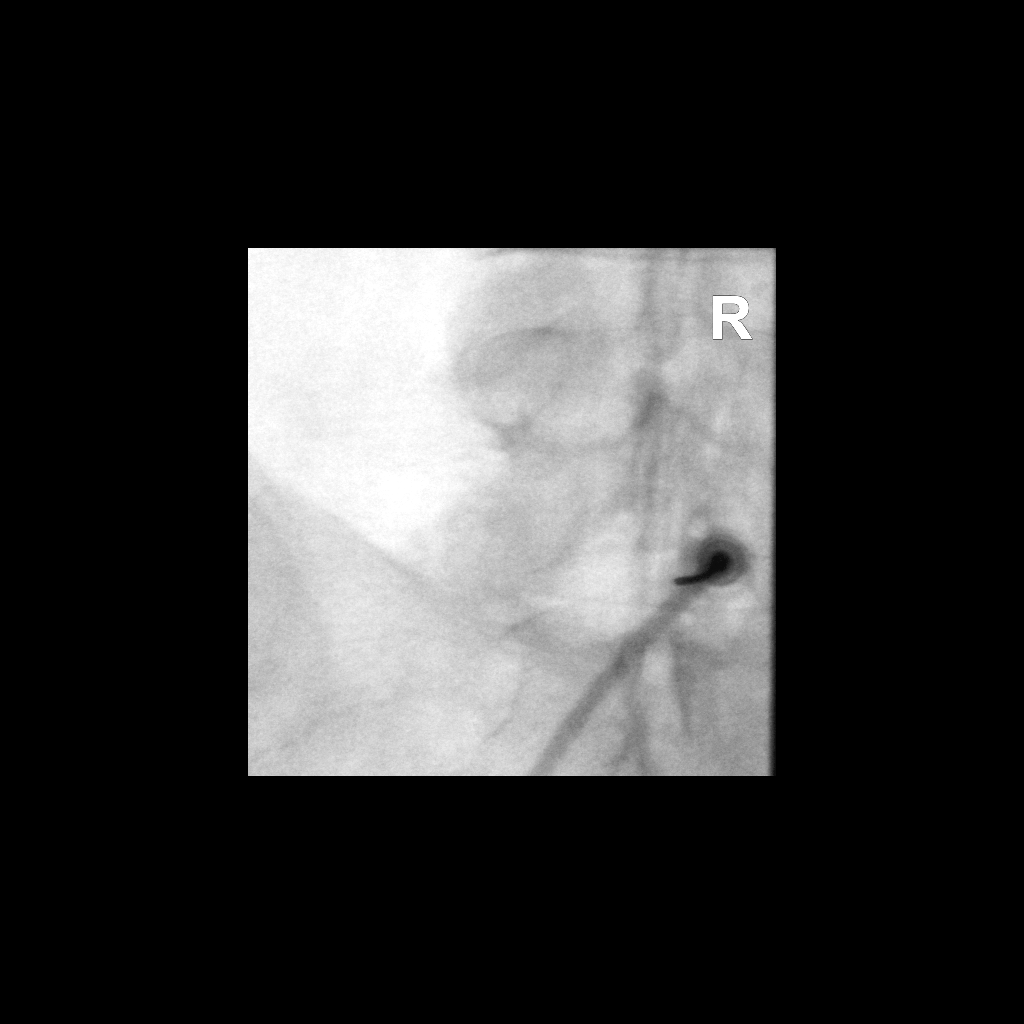

[2 of 2 positions shown; findings below may reference images not displayed]

FLUOROSCOPY TIME:  0 minutes 29 seconds. 29.98 micro gray meter
squared

PROCEDURE:
The procedure, risks, benefits, and alternatives were explained to
the patient. Questions regarding the procedure were encouraged and
answered. The patient understands and consents to the procedure.

LUMBAR EPIDURAL INJECTION:

An interlaminar approach was performed on right at L5. The overlying
skin was cleansed and anesthetized. A 20 gauge epidural needle was
advanced using loss-of-resistance technique.

DIAGNOSTIC EPIDURAL INJECTION:

Injection of Isovue-M 200 shows a good epidural pattern with spread
above and below the level of needle placement, primarily on the
right, but to both sides. No vascular opacification is seen.

THERAPEUTIC EPIDURAL INJECTION:

One hundred twenty mg of Depo-Medrol mixed with 2.5 cc 1% lidocaine
were instilled. The procedure was well-tolerated, and the patient
was discharged thirty minutes following the injection in good
condition.

COMPLICATIONS:
None
IMPRESSION: Technically successful epidural injection on the right at L5.

## 2021-04-20 ENCOUNTER — Emergency Department (HOSPITAL_BASED_OUTPATIENT_CLINIC_OR_DEPARTMENT_OTHER)
Admission: EM | Admit: 2021-04-20 | Discharge: 2021-04-20 | Disposition: A | Discharge status: Home / Self Care | Payer: Medicaid Other | Attending: Emergency Medicine | Admitting: Emergency Medicine

## 2021-04-20 ENCOUNTER — Emergency Department (HOSPITAL_BASED_OUTPATIENT_CLINIC_OR_DEPARTMENT_OTHER): Payer: Medicaid Other

## 2021-04-20 ENCOUNTER — Encounter (HOSPITAL_BASED_OUTPATIENT_CLINIC_OR_DEPARTMENT_OTHER): Payer: Self-pay | Admitting: *Deleted

## 2021-04-20 ENCOUNTER — Other Ambulatory Visit: Payer: Self-pay

## 2021-04-20 DIAGNOSIS — M25531 Pain in right wrist: Secondary | ICD-10-CM | POA: Insufficient documentation

## 2021-04-20 DIAGNOSIS — M79604 Pain in right leg: Secondary | ICD-10-CM

## 2021-04-20 DIAGNOSIS — M25561 Pain in right knee: Secondary | ICD-10-CM

## 2021-04-20 LAB — CBC WITH DIFFERENTIAL/PLATELET
Abs Immature Granulocytes: 0.02 10*3/uL (ref 0.00–0.07)
Basophils Absolute: 0 10*3/uL (ref 0.0–0.1)
Basophils Relative: 0 %
Eosinophils Absolute: 0.1 10*3/uL (ref 0.0–0.5)
Eosinophils Relative: 1 %
HCT: 43.9 % (ref 36.0–46.0)
Hemoglobin: 14.8 g/dL (ref 12.0–15.0)
Immature Granulocytes: 0 %
Lymphocytes Relative: 24 %
Lymphs Abs: 2.3 10*3/uL (ref 0.7–4.0)
MCH: 30.7 pg (ref 26.0–34.0)
MCHC: 33.7 g/dL (ref 30.0–36.0)
MCV: 91.1 fL (ref 80.0–100.0)
Monocytes Absolute: 0.7 10*3/uL (ref 0.1–1.0)
Monocytes Relative: 7 %
Neutro Abs: 6.6 10*3/uL (ref 1.7–7.7)
Neutrophils Relative %: 68 %
Platelets: 365 10*3/uL (ref 150–400)
RBC: 4.82 MIL/uL (ref 3.87–5.11)
RDW: 12.7 % (ref 11.5–15.5)
WBC: 9.8 10*3/uL (ref 4.0–10.5)
nRBC: 0 % (ref 0.0–0.2)

## 2021-04-20 LAB — URINALYSIS, ROUTINE W REFLEX MICROSCOPIC
Bilirubin Urine: NEGATIVE
Glucose, UA: NEGATIVE mg/dL
Ketones, ur: NEGATIVE mg/dL
Leukocytes,Ua: NEGATIVE
Nitrite: NEGATIVE
Protein, ur: NEGATIVE mg/dL
Specific Gravity, Urine: 1.02 (ref 1.005–1.030)
pH: 5.5 (ref 5.0–8.0)

## 2021-04-20 LAB — URINALYSIS, MICROSCOPIC (REFLEX)

## 2021-04-20 LAB — BASIC METABOLIC PANEL
Anion gap: 9 (ref 5–15)
BUN: 14 mg/dL (ref 6–20)
CO2: 23 mmol/L (ref 22–32)
Calcium: 9.5 mg/dL (ref 8.9–10.3)
Chloride: 104 mmol/L (ref 98–111)
Creatinine, Ser: 0.74 mg/dL (ref 0.44–1.00)
GFR, Estimated: >60 mL/min (ref >60)
Glucose, Bld: 116 mg/dL — ABNORMAL HIGH (ref 70–99)
Potassium: 4.1 mmol/L (ref 3.5–5.1)
Sodium: 136 mmol/L (ref 135–145)

## 2021-04-20 LAB — D-DIMER, QUANTITATIVE: D-Dimer, Quant: 0.47 ug/mL-FEU (ref 0.00–0.50)

## 2021-04-20 MED ORDER — DICLOFENAC SODIUM 1 % EX GEL: 2.0000 g | Freq: Four times a day (QID) | CUTANEOUS | 1 refills | Status: DC

## 2021-04-20 NOTE — Discharge Instructions (Addendum)
Call your primary care doctor tomorrow and inform her of the visit today.  See if she wants to refer you to orthopedic or additional specialty for work-up.  Work-up today did not show any signs of blood clots.  Use compression stocking and Voltaren gel for pain.  Compression stockings when you are walking.  Take Tylenol and ibuprofen for pain throughout the day and evening.

## 2021-04-20 NOTE — ED Triage Notes (Signed)
Swelling to her right lower leg on the medial side of her leg. No injury. Pain and swelling increased yesterday. Pain behind her knee.

## 2021-04-20 NOTE — ED Provider Notes (Signed)
[facility] EMERGENCY DEPARTMENT Provider Note   CSN: [id number] Arrival date & time: 04/20/21  1356     History  Chief Complaint  Patient presents with   Leg Pain    Kaitlyn Carter is a 56 y.o. female.   Leg Pain  Patient with medical history of anxiety, dysrhythmia, chronic back pain without sciatica, spinal stenosis of lumbar region with neurogenic claudication presents due to right knee pain.  Patient reports atraumatic pain to the medial side of her right knee and behind her knee.  She noticed it 3 weeks ago but yesterday the pain increased severely.  The swelling improves when she is lying supine and increases to the medial side whenever she stands upright.  She has tried 400 of ibuprofen with minimal improvement.  No trauma, no fevers, no skin color changes to the knee, no history of PE or DVT, not worse with movement, no shortness of breath or chest pain.  Home Medications Prior to Admission medications   Medication Sig Start Date End Date Taking? Authorizing Provider  diclofenac Sodium (VOLTAREN) 1 % GEL Apply 2 g topically 4 (four) times daily. 04/20/21  Yes Theron Arista, PA-C  sertraline (ZOLOFT) 100 MG tablet Take 100 mg by mouth daily.   Yes [provider]  ALPRAZolam Prudy Feeler) 0.5 MG tablet Take 0.5 mg by mouth daily as needed for sleep. 11/05/19   [provider]  amphetamine-dextroamphetamine (ADDERALL XR) 30 MG 24 hr capsule Take 1 capsule by mouth daily as needed (ADHD).  01/28/15   [provider]  buPROPion (WELLBUTRIN XL) 300 MG 24 hr tablet Take 300 mg by mouth daily.    [provider]  gabapentin (NEURONTIN) 300 MG capsule Take 1 capsule (300 mg total) by mouth 3 (three) times daily. 06/20/19   Myra Rude, MD  levocetirizine (XYZAL) 5 MG tablet Take 5 mg by mouth at bedtime. 11/28/19   [provider]      Allergies    Other, Adhesive [tape], and Latex    Review of Systems   Review of  Systems  Physical Exam Updated Vital Signs BP (!) 133/94 (BP Location: Right Arm)    Pulse (!) 109    Temp 99.4 F (37.4 C) (Oral)    Resp 20    Ht 5\' 5"  (1.651 m)    Wt 81.6 kg    SpO2 98%    BMI 29.95 kg/m  Physical Exam Vitals and nursing note reviewed. Exam conducted with a chaperone present.  Constitutional:      General: She is not in acute distress.    Appearance: Normal appearance.  HENT:     Head: Normocephalic and atraumatic.  Eyes:     General: No scleral icterus.    Extraocular Movements: Extraocular movements intact.     Pupils: Pupils are equal, round, and reactive to light.  Cardiovascular:     Rate and Rhythm: Normal rate and regular rhythm.     Pulses: Normal pulses.  Pulmonary:     Effort: Pulmonary effort is normal.  Musculoskeletal:        General: Swelling and tenderness present. Normal range of motion.     Comments: Tenderness to palpation to the posterior aspect of the knee.  Tolerates active and passive range of motion without any reproduction of pain.  There is no point tenderness.  No swelling noted when supine but when standing there is some swelling to the medial side of the right knee.  Skin:  Capillary Refill: Capillary refill takes less than 2 seconds.     Coloration: Skin is not jaundiced.     Findings: No erythema.     Comments: No erythema or discoloration.  Neurological:     General: No focal deficit present.     Mental Status: She is alert. Mental status is at baseline.     Coordination: Coordination normal.    ED Results / Procedures / Treatments   Labs (all labs ordered are listed, but only abnormal results are displayed) Labs Reviewed  BASIC METABOLIC PANEL - Abnormal; Notable for the following components:      Result Value   Glucose, Bld 116 (*)    All other components within normal limits  URINALYSIS, ROUTINE W REFLEX MICROSCOPIC - Abnormal; Notable for the following components:   Hgb urine dipstick TRACE (*)    All other  components within normal limits  URINALYSIS, MICROSCOPIC (REFLEX) - Abnormal; Notable for the following components:   Bacteria, UA RARE (*)    All other components within normal limits  D-DIMER, QUANTITATIVE  CBC WITH DIFFERENTIAL/PLATELET    EKG None  Radiology US Venous Img Lower Unilateral Right (DVT)  Result Date: 04/20/2021 CLINICAL DATA:  Right leg pain. EXAM: RIGHT LOWER EXTREMITY VENOUS DOPPLER ULTRASOUND TECHNIQUE: Gray-scale sonography with compression, as well as color and duplex ultrasound, were performed to evaluate the deep venous system(s) from the level of the common femoral vein through the popliteal and proximal calf veins. COMPARISON:  None. FINDINGS: VENOUS Normal compressibility of the common femoral, superficial femoral, and popliteal veins, as well as the visualized calf veins. Visualized portions of profunda femoral vein and great saphenous vein unremarkable. No filling defects to suggest DVT on grayscale or color Doppler imaging. Doppler waveforms show normal direction of venous flow, normal respiratory plasticity and response to augmentation. Limited views of the contralateral common femoral vein are unremarkable. IMPRESSION: No evidence of DVT in the right lower extremity. Electronically Signed   By: Feliberto Harts M.D.   On: 04/20/2021 14:59    Procedures Procedures    Medications Ordered in ED Medications - No data to display  ED Course/ Medical Decision Making/ A&P                           Medical Decision Making Amount and/or Complexity of Data Reviewed Independent Historian: parent Labs: ordered. Radiology: ordered. Decision-making details documented in ED Course. ECG/medicine tests:  Decision-making details documented in ED Course.  Risk OTC drugs. Prescription drug management. Decision regarding hospitalization.   Patient presents with right knee pain and swelling.  Differential diagnosis includes but is not limited to septic joint, DVT,  cellulitis, fracture, dislocation  Given she is neurovascularly intact, good range of motion, and is dependent effusion I doubt this is a septic joint, fracture, dislocation.  Skin does not appear erythematous or warm, I considered but ultimately do not think this is cellulitis.  I viewed and independently interpreted labs, duplex and radiograph.  No leukocytosis or anemia, dimer is negative, BMP without any gross electrolyte derangement.  Given dimer is negative unlikely to have ultrasound, duplex study was already performed at this point and does not show any evidence of DVT or any superficial phlebitis.  I viewed the image and agree with this interpretation.  I ordered an x-ray and independently interpreted it.  I do not see any fracture or soft tissue swelling or effusion.   Discussed with the patient, she has  excellent follow-up and is going to call her PCP for evaluation tomorrow.          Final Clinical Impression(s) / ED Diagnoses Final diagnoses:  Acute pain of right knee    Rx / DC Orders ED Discharge Orders          Ordered    Compression stockings        04/20/21 1748    diclofenac Sodium (VOLTAREN) 1 % GEL  4 times daily        04/20/21 1748              Theron Arista, PA-C 04/20/21 1854    Rolan Bucco, MD 04/20/21 2322
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
## 2021-04-26 ENCOUNTER — Ambulatory Visit: Payer: Self-pay

## 2021-04-26 ENCOUNTER — Other Ambulatory Visit: Payer: Self-pay

## 2021-04-26 ENCOUNTER — Encounter: Payer: Self-pay | Admitting: Family Medicine

## 2021-04-26 ENCOUNTER — Ambulatory Visit (INDEPENDENT_AMBULATORY_CARE_PROVIDER_SITE_OTHER): Payer: Medicaid Other | Admitting: Family Medicine

## 2021-04-26 ENCOUNTER — Encounter: Payer: Self-pay | Admitting: Neurology

## 2021-04-26 ENCOUNTER — Ambulatory Visit (INDEPENDENT_AMBULATORY_CARE_PROVIDER_SITE_OTHER): Payer: Medicaid Other

## 2021-04-26 VITALS — BP 122/86 | HR 106 | Ht 65.0 in

## 2021-04-26 DIAGNOSIS — M7061 Trochanteric bursitis, right hip: Secondary | ICD-10-CM

## 2021-04-26 DIAGNOSIS — M25561 Pain in right knee: Secondary | ICD-10-CM

## 2021-04-26 DIAGNOSIS — M48062 Spinal stenosis, lumbar region with neurogenic claudication: Secondary | ICD-10-CM

## 2021-04-26 DIAGNOSIS — L89896 Pressure-induced deep tissue damage of other site: Secondary | ICD-10-CM | POA: Diagnosis not present

## 2021-04-26 DIAGNOSIS — R202 Paresthesia of skin: Secondary | ICD-10-CM
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
## 2021-04-26 NOTE — Assessment & Plan Note (Signed)
Injection given today and tolerated the procedure well.  Discussed icing regimen and home exercises. Patient given hip abductor strengthening.  Discussed gabapentin as a possibility.  Once again getting a nerve conduction study to further evaluate how much of this plus possible weakness could be from the back.  Follow-up again in 4 to 6 weeks

## 2021-04-26 NOTE — Patient Instructions (Addendum)
Referral to Neurology Injected the hip Xray today Calf compression sleeve to wear daily but not at night Heel lifts Heat and massage the area See you again in 4 weeks

## 2021-04-27 ENCOUNTER — Ambulatory Visit: Payer: Medicaid Other | Admitting: Neurology

## 2021-04-27 DIAGNOSIS — R202 Paresthesia of skin: Secondary | ICD-10-CM

## 2021-04-27 DIAGNOSIS — M5417 Radiculopathy, lumbosacral region: Secondary | ICD-10-CM

## 2021-04-27 NOTE — Procedures (Signed)
[facility]  [address] [place], [address]  [place], [place] [address] Tel: [phone] Fax:  [phone] Test Date:  04/27/2021  Patient: Kaitlyn Carter DOB: 05/30/1965 Physician: Nita Sickle, DO  Sex: Female Height: 5\' 5"  Ref Phys: , DO  ID#: [id number] Primas   Technician:    Patient Complaints: This is a 56 year old female referred for evaluation of right leg pain.  NCV & EMG Findings: Extensive electrodiagnostic testing of the right lower extremity and additional studies of the left shows:  Right sural and superficial peroneal sensory responses are within normal limits. Right peroneal motor response at the extensor digitorum brevis is absent, and normal at the tibialis anterior.  Right tibial motor responses within normal limits. Right tibial H reflex study is mildly prolonged. Chronic motor axonal loss changes are seen affecting the S1 myotome, without accompanied active denervation.  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 There is no evidence of a sensorimotor polyneuropathy affecting the right lower extremity.   ___________________________ 40, DO    Nerve Conduction Studies Anti Sensory Summary Table   Stim Site NR Peak (ms) Norm Peak (ms) P-T Amp (V) Norm P-T Amp  Right Sup Peroneal Anti Sensory (Ant Lat Mall)  32C  12 cm    2.5 <4.6 13.2 >4  Right Sural Anti Sensory (Lat Mall)  32C  Calf    2.8 <4.6 17.0 >4   Motor Summary Table   Stim Site NR Onset (ms) Norm Onset (ms) O-P Amp (mV) Norm O-P Amp Site1 Site2 Delta-0 (ms) Dist (cm) Vel (m/s) Norm Vel (m/s)  Right Peroneal Motor (Ext Dig Brev)  32C  Ankle NR  <6.0  >2.5 B Fib Ankle  0.0  >40  B Fib NR     Poplt B Fib  0.0  >40  Poplt NR            Right Peroneal TA Motor (Tib Ant)  32C  Fib Head    2.1 <4.5 5.3 >3 Poplit Fib Head 1.3 7.0 54 >40  Poplit    3.4  5.1         Right Tibial Motor (Abd Hall Brev)  32C  Ankle    4.1 <6.0  10.6 >4 Knee Ankle 6.8 41.0 60 >40  Knee    10.9  7.4          H Reflex Studies   NR H-Lat (ms) Lat Norm (ms) L-R H-Lat (ms)  Right Tibial (Gastroc)  32C     36.46 <35    EMG   Side Muscle Ins Act Fibs Psw Fasc Number Recrt Dur Dur. Amp Amp. Poly Poly. Comment  Right AntTibialis Nml Nml Nml Nml Nml Nml Nml Nml Nml Nml Nml Nml N/A  Right Gastroc Nml Nml Nml Nml 1- Rapid Some 1+ Some 1+ Some 1+ N/A  Right Flex Dig Long Nml Nml Nml Nml Nml Nml Nml Nml Nml Nml Nml Nml N/A  Right RectFemoris Nml Nml Nml Nml Nml Nml Nml Nml Nml Nml Nml Nml N/A  Right GluteusMed Nml Nml Nml Nml Nml Nml Nml Nml Nml Nml Nml Nml N/A  Right BicepsFemS Nml Nml Nml Nml 1- Rapid Some 1+ Some 1+ Some 1+ N/A      Waveforms:

## 2021-04-29 ENCOUNTER — Encounter: Payer: Self-pay | Admitting: Family Medicine

## 2021-05-25 ENCOUNTER — Encounter: Payer: Medicaid Other | Admitting: Neurology

## 2021-06-02 NOTE — Progress Notes (Signed)
?Charlann Boxer D.O. ?[facility] ?[place] ?Phone: [phone] ?Subjective:   ?I, Kaitlyn Carter, am serving as a Education administrator for Dr. Hulan Saas. ?This visit occurred during the SARS-CoV-2 public health emergency.  Safety protocols were in place, including screening questions prior to the visit, additional usage of staff PPE, and extensive cleaning of exam room while observing appropriate contact time as indicated for disinfecting solutions.  ? ?I'm seeing this patient by the request  of:  [name], PA-C ? ?CC: Low back and leg pain ? ?[id number]  ?04/26/2021 ?Pressure injury of deep tissue of right calf ?I believe that patient did have more of an injury to the right calf area.  X-rays are ordered today for further evaluation for underlying arthritis that could be playing a role.  Patient did have some type of compression or pulling by another individual on the leg.  Patient was seen in the emergency department and ruled out any deep venous thrombosis.  Discussed with patient at this point that on ultrasound appears to have some possible bleeding into the soft tissues which makes it impossible to truly do any aspiration.  Differential includes the possibility of a lumbar radiculopathy that could be contributing from her previous surgery.  Ultrasound did show the patient did have some fatty atrophy as some of the musculature in nature.  At this point I do think a nerve conduction study would be beneficial to help [place] know the ramifications of the low back and how much it is playing a role.  New x-rays of the lumbar spine also ordered.  Follow-up with me again in 6 to 8 weeks ?  ?Greater trochanteric bursitis of right hip ?Injection given today and tolerated the procedure well.  Discussed icing regimen and home exercises. ?Patient given hip abductor strengthening.  Discussed gabapentin as a possibility.  Once again getting a nerve conduction study to further  evaluate how much of this plus possible weakness could be from the back.  Follow-up again in 4 to 6 weeks  ? ?Update 06/03/2021 ?Kaitlyn Carter is a 56 y.o. female coming in with complaint of R calf and R hip pain. Patient states no pain but still swollen. Pain in hip also gone, but has lower back pain. No new complaints.  Patient did have a nerve conduction test noted previously.  Nerve conduction test did show an S1 nerve root chronic area demyelination noted. ? ? ? ?  ? ?Past Medical History:  ?Diagnosis Date  ? Anxiety   ? 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 Attention deficit disorder   ? Dyspareunia   ? Dysrhythmia   ? ..this was 17 yrs ago and was seen Dr Angelena Form  ? GERD (gastroesophageal reflux disease)   ? Headache(784.0)   ? due to neck - otc meds prn  ? Herpes simplex type 1 infection   ? PONV (postoperative nausea and vomiting)   ? Seasonal allergies   ? Sleep apnea   ? cpap  ? SVD (spontaneous vaginal delivery)   ? x 2  ? Urinary incontinence   ? ?Past Surgical History:  ?Procedure Laterality Date  ? ABDOMINAL HYSTERECTOMY  2001  ? [facility]-Dr. Gus Height  ? ANTERIOR CERVICAL DECOMPRESSION/DISCECTOMY FUSION 4 LEVEL/HARDWARE REMOVAL N/A 11/16/2012  ? Procedure: Cervical Three-Four, Cervical Four-Five, Cervical Six-Seven, Cervical Seven-Thoracic One Anterior cervical decompression/diskectomy/fusion/possible removal of plate at W3-8;  Surgeon: Floyce Stakes, MD;  Location: [facility];  Service: Neurosurgery;  Laterality:  N/A;  ANTERIOR CERVICAL DECOMPRESSION/DISCECTOMY FUSION 4 LEVEL/HARDWARE REMOVAL  ? AUGMENTATION MAMMAPLASTY  2002  ? BACK SURGERY    ? BREAST SURGERY    ? CERVICAL FUSION    ? c3-4  ? CYSTOSCOPY N/A 01/29/2013  ? Procedure: CYSTOSCOPY;  Surgeon: Reece Packer, MD;  Location: [facility];  Service: Urology;  Laterality: N/A;  ? INCONTINENCE SURGERY  03/2002  ? [place] sling--Dr. Michaelle Birks in  [facility]  ? LUMBAR LAMINECTOMY/ DECOMPRESSION WITH MET-RX N/A 02/10/2020  ? Procedure: Lumbar Four-Five   MIS laminectomy;  Surgeon: Judith Part, MD;  Location: [facility];  Service: Neurosurgery;  Laterality: N/A;  posterior  ? PUBOVAGINAL SLING N/A 01/29/2013  ? Procedure: REMOVAL OF SLING ; INSERTION OF XEROFORM GRAFT;  Surgeon: Reece Packer, MD;  Location: [facility];  Service: Urology;  Laterality: N/A;  ? WISDOM TOOTH EXTRACTION    ? ?Social History  ? ?Socioeconomic History  ? Marital status: Divorced  ?  Spouse name: Not on file  ? Number of children: Not on file  ? Years of education: Not on file  ? Highest education level: Not on file  ?Occupational History  ? Not on file  ?Tobacco Use  ? Smoking status: Never  ? Smokeless tobacco: Never  ?Vaping Use  ? Vaping Use: Never used  ?Substance and Sexual Activity  ? Alcohol use: Yes  ?  Alcohol/week: 0.0 standard drinks  ?  Comment: socially  ? Drug use: No  ? Sexual activity: Yes  ?  Partners: Male  ?  Birth control/protection: Surgical  ?  Comment: [facility] 2001  ?Other Topics Concern  ? Not on file  ?Social History Narrative  ? Not on file  ? ?Social Determinants of Health  ? ?Financial Resource Strain: Not on file  ?Food Insecurity: Not on file  ?Transportation Needs: Not on file  ?Physical Activity: Not on file  ?Stress: Not on file  ?Social Connections: Not on file  ? ?Allergies  ?Allergen Reactions  ? Other Swelling  ?  Pt states she had previous reaction to a patch for nausea.  Pt states that it caused swelling  ? Adhesive [Tape] Rash  ? Latex Rash  ? ?Family History  ?Problem Relation Age of Onset  ? Depression Mother   ? Asthma Mother   ? Diabetes Father   ? Hypertension Father   ? Heart disease Father   ? Kidney disease Father   ? Heart failure Father   ? Breast cancer Maternal Grandmother   ? Hypertension Maternal Grandmother   ? Hypertension Maternal Grandfather   ? Hypertension Paternal Grandfather   ? Stroke Paternal Grandfather   ? CAD Paternal Uncle   ? ? ? ? ?Current Outpatient Medications (Respiratory):  ?  levocetirizine (XYZAL) 5 MG tablet, Take  5 mg by mouth at bedtime. ? ? ? ?Current Outpatient Medications (Other):  ?  ALPRAZolam (XANAX) 0.5 MG tablet, Take 0.5 mg by mouth daily as needed for sleep. ?  amphetamine-dextroamphetamine (ADDERALL XR) 30 MG 24 hr capsule, Take 1 capsule by mouth daily as needed (ADHD).  ?  buPROPion (WELLBUTRIN XL) 300 MG 24 hr tablet, Take 300 mg by mouth daily. ?  diclofenac Sodium (VOLTAREN) 1 % GEL, Apply 2 g topically 4 (four) times daily. ?  gabapentin (NEURONTIN) 300 MG capsule, Take 1 capsule (300 mg total) by mouth 3 (three) times daily. ?  sertraline (ZOLOFT) 100 MG tablet, Take 100 mg by mouth daily. ? ? ?Reviewed  prior external information including notes and imaging from  ?primary care provider ?As well as notes that were available from care everywhere and other healthcare systems. ? ?Past medical history, social, surgical and family history all reviewed in electronic medical record.  No pertanent information unless stated regarding to the chief complaint.  ? ?Review of Systems: ? No headache, visual changes, nausea, vomiting, diarrhea, constipation, dizziness, abdominal pain, skin rash, fevers, chills, night sweats, weight loss, swollen lymph nodes, body aches, joint swelling, chest pain, shortness of breath, mood changes. POSITIVE muscle aches ? ?Objective  ?Blood pressure 110/80, pulse 100, height 5' 5"  (1.651 m), SpO2 98 %. ?  ?General: No apparent distress alert and oriented x3 mood and affect normal, dressed appropriately.  ?HEENT: Pupils equal, extraocular movements intact  ?Respiratory: Patient's speak in full sentences and does not appear short of breath  ?Cardiovascular: No lower extremity edema, non tender, no erythema  ?Gait normal with good balance and coordination.  ?MSK: On exam patient Appears to be fairly unremarkable.  Patient does have some bruising bilaterally changes of the knees bilaterally. ? ?Limited muscular skeletal ultrasound was performed and interpreted by Hulan Saas, M  ?Limited  ultrasound shows no significant soft tissue changes of the calf noted today. ? ?  ?Impression and Recommendations:  ?  ? ?The above documentation has been reviewed and is accurate and complete Lyndal Pulley

## 2021-06-03 ENCOUNTER — Ambulatory Visit (INDEPENDENT_AMBULATORY_CARE_PROVIDER_SITE_OTHER): Payer: Medicaid Other | Admitting: Family Medicine

## 2021-06-03 ENCOUNTER — Ambulatory Visit: Payer: Self-pay

## 2021-06-03 ENCOUNTER — Encounter: Payer: Self-pay | Admitting: Family Medicine

## 2021-06-03 VITALS — BP 110/80 | HR 100 | Ht 65.0 in

## 2021-06-03 DIAGNOSIS — M48062 Spinal stenosis, lumbar region with neurogenic claudication: Secondary | ICD-10-CM | POA: Diagnosis not present

## 2021-06-03 DIAGNOSIS — L89896 Pressure-induced deep tissue damage of other site: Secondary | ICD-10-CM | POA: Diagnosis not present

## 2021-06-03 NOTE — Patient Instructions (Addendum)
So glad you're doing so much better ?Do feel like some of the leg is coming from the back ?See [place] when you need [place] ?

## 2021-06-03 NOTE — Assessment & Plan Note (Signed)
Known spinal stenosis.  Patient did have some mild atrophy noted of the lower extremity.  Patient did have a nerve conduction study showing at this moment chronic radiculopathy mild to moderate in nature.  I do think that that is likely the cause.  I do believe that the pressure injury further.  The right calf has nearly fully resolved at the moment.  Patient should be able to increase activity as tolerated.  We will monitor for any weakness of the lower extremity.  Follow-up with pain medicine with me as needed ?

## 2021-11-30 NOTE — Progress Notes (Unsigned)
Kaitlyn Carter [address] [place] Phone: [phone] Subjective:   Kaitlyn Carter, am serving as a scribe for Dr. Hulan Saas.  I'm seeing this patient by the request  of:  [name], PA-C  CC: right hip pain   [id number]  06/03/2021 Known spinal stenosis.  Patient did have some mild atrophy noted of the lower extremity.  Patient did have a nerve conduction study showing at this moment chronic radiculopathy mild to moderate in nature.  I do think that that is likely the cause.  I do believe that the pressure injury further.  The right calf has nearly fully resolved at the moment.  Patient should be able to increase activity as tolerated.  We will monitor for any weakness of the lower extremity.  Follow-up with pain medicine with me as needed  Updated 12/01/2021 Kaitlyn Carter is a 56 y.o. female coming in with complaint of R hip pain. Would like injection patient has been seen for greater trochanteric bursitis previously in February of this year given injection.  Patient does admit conduction studies of it also shows patient does have chronic radiculopathy from patient's previous back surgery.  Patient states pain is radiating down back of thigh to knee and sometimes feels like calves are cramping. Pain is constant and at times can be excruciating.       Past Medical History:  Diagnosis Date   Anxiety    Arthritis    neck   Attention deficit disorder    Dyspareunia    Dysrhythmia    ..this was 17 yrs ago and was seen Dr Angelena Form   GERD (gastroesophageal reflux disease)    Headache(784.0)    due to neck - otc meds prn   Herpes simplex type 1 infection    PONV (postoperative nausea and vomiting)    Seasonal allergies    Sleep apnea    cpap   SVD (spontaneous vaginal delivery)    x 2   Urinary incontinence    Past Surgical History:  Procedure Laterality Date   ABDOMINAL HYSTERECTOMY  2001   [place] DECOMPRESSION/DISCECTOMY FUSION 4 LEVEL/HARDWARE REMOVAL N/A 11/16/2012   Procedure: Cervical Three-Four, Cervical Four-Five, Cervical Six-Seven, Cervical Seven-Thoracic One Anterior cervical decompression/diskectomy/fusion/possible removal of plate at I7-7;  Surgeon: Floyce Stakes, MD;  Location: [facility];  Service: Neurosurgery;  Laterality: N/A;  ANTERIOR CERVICAL DECOMPRESSION/DISCECTOMY FUSION 4 LEVEL/HARDWARE REMOVAL   AUGMENTATION MAMMAPLASTY  2002   BACK SURGERY     BREAST SURGERY     CERVICAL FUSION     c3-4   CYSTOSCOPY N/A 01/29/2013   Procedure: CYSTOSCOPY;  Surgeon: Reece Packer, MD;  Location: [facility];  Service: Urology;  Laterality: N/A;   INCONTINENCE SURGERY  03/2002   [facility] sling--Dr. Michaelle Birks in  [place] MET-RX N/A 02/10/2020   Procedure: Lumbar Four-Five  MIS laminectomy;  Surgeon: Judith Part, MD;  Location: [facility];  Service: Neurosurgery;  Laterality: N/A;  posterior   PUBOVAGINAL SLING N/A 01/29/2013   Procedure: REMOVAL OF SLING ; INSERTION OF XEROFORM GRAFT;  Surgeon: Reece Packer, MD;  Location: [facility];  Service: Urology;  Laterality: N/A;   WISDOM TOOTH EXTRACTION     Social History   Socioeconomic History   Marital status: Divorced    Spouse name: Not on file   Number of children: Not on file  Years of education: Not on file   Highest education level: Not on file  Occupational History   Not on file  Tobacco Use   Smoking status: Never   Smokeless tobacco: Never  Vaping Use   Vaping Use: Never used  Substance and Sexual Activity   Alcohol use: Yes    Alcohol/week: 0.0 standard drinks of alcohol    Comment: socially   Drug use: No   Sexual activity: Yes    Partners: Male    Birth control/protection: Surgical    Comment: [facility] 2001  Other Topics Concern   Not on file  Social History Narrative   Not on file   Social Determinants of Health    Financial Resource Strain: Not on file  Food Insecurity: Not on file  Transportation Needs: Not on file  Physical Activity: Not on file  Stress: Not on file  Social Connections: Not on file   Allergies  Allergen Reactions   Other Swelling    Pt states she had previous reaction to a patch for nausea.  Pt states that it caused swelling   Adhesive [Tape] Rash   Latex Rash   Family History  Problem Relation Age of Onset   Depression Mother    Asthma Mother    Diabetes Father    Hypertension Father    Heart disease Father    Kidney disease Father    Heart failure Father    Breast cancer Maternal Grandmother    Hypertension Maternal Grandmother    Hypertension Maternal Grandfather    Hypertension Paternal Grandfather    Stroke Paternal Grandfather    CAD Paternal Uncle       Current Outpatient Medications (Respiratory):    levocetirizine (XYZAL) 5 MG tablet, Take 5 mg by mouth at bedtime.    Current Outpatient Medications (Other):    ALPRAZolam (XANAX) 0.5 MG tablet, Take 0.5 mg by mouth daily as needed for sleep.   amphetamine-dextroamphetamine (ADDERALL XR) 30 MG 24 hr capsule, Take 1 capsule by mouth daily as needed (ADHD).    buPROPion (WELLBUTRIN XL) 300 MG 24 hr tablet, Take 300 mg by mouth daily.   diclofenac Sodium (VOLTAREN) 1 % GEL, Apply 2 g topically 4 (four) times daily.   gabapentin (NEURONTIN) 300 MG capsule, Take 1 capsule (300 mg total) by mouth 3 (three) times daily.   sertraline (ZOLOFT) 100 MG tablet, Take 100 mg by mouth daily.   Reviewed prior external information including notes and imaging from  primary care provider As well as notes that were available from care everywhere and other healthcare systems.  Past medical history, social, surgical and family history all reviewed in electronic medical record.  No pertanent information unless stated regarding to the chief complaint.   Review of Systems:  No headache, visual changes, nausea,  vomiting, diarrhea, constipation, dizziness, abdominal pain, skin rash, fevers, chills, night sweats, weight loss, swollen lymph nodes, body aches, joint swelling, chest pain, shortness of breath, mood changes. POSITIVE muscle aches  Objective  There were no vitals taken for this visit.   General: No apparent distress alert and oriented x3 mood and affect normal, dressed appropriately.  HEENT: Pupils equal, extraocular movements intact  Respiratory: Patient's speak in full sentences and does not appear short of breath  Cardiovascular: No lower extremity edema, non tender, no erythema  Low back exam shows       Impression and Recommendations:    The above documentation has been reviewed and is accurate and complete Alroy Dust  Koren Bound, DO

## 2021-12-01 ENCOUNTER — Ambulatory Visit: Payer: BLUE CROSS/BLUE SHIELD | Admitting: Family Medicine

## 2021-12-01 ENCOUNTER — Ambulatory Visit: Payer: Self-pay

## 2021-12-01 VITALS — BP 122/90 | HR 90 | Ht 65.0 in | Wt 163.0 lb

## 2021-12-01 DIAGNOSIS — M48062 Spinal stenosis, lumbar region with neurogenic claudication: Secondary | ICD-10-CM

## 2021-12-01 DIAGNOSIS — M7061 Trochanteric bursitis, right hip: Secondary | ICD-10-CM

## 2021-12-01 DIAGNOSIS — M7062 Trochanteric bursitis, left hip: Secondary | ICD-10-CM

## 2021-12-01 NOTE — Patient Instructions (Addendum)
If not improving in 10 days give [place] a call for MRI Injected both hips today See me again just in case. Can cancel or move appointment out if feeling good

## 2021-12-02 NOTE — Assessment & Plan Note (Signed)
I am concerned that patient is actually having more of the spinal stenosis with radiculopathy and neurogenic claudication that could be contributing to some of her pain and leg pain.  On exam today the patient did have some hypoechoic changes on ultrasound and we did attempt a greater trochanteric injections.  Last ones were in February and hopefully that this will make improvement again.  We discussed with patient that she should continue on the gabapentin.  Can take it at night.  We will need to consider the possibility of changing medication of Zoloft to either Cymbalta or Effexor.  Also consider the possibility of epidurals if we need to.  Following up with me again in 6 to 8 weeks

## 2021-12-02 NOTE — Assessment & Plan Note (Signed)
Bilateral injections given today, tolerated the procedure well, discussed icing regimen and home exercises.  We discussed which activities to do and which ones to avoid, increase activity slowly.  Discussed icing regimen.  Follow-up with me again in 6 to 8 weeks still concerned that more of the pain is from lumbar radiculopathy and may need to consider another epidural for patient's severe spinal stenosis at L4-L5

## 2021-12-15 ENCOUNTER — Ambulatory Visit: Payer: Medicaid Other | Admitting: Family Medicine

## 2022-01-31 NOTE — Progress Notes (Unsigned)
[facility] [place] Phone: [phone] Subjective:   Kaitlyn Carter, am serving as a scribe for Dr. Hulan Saas.  I'm seeing this patient by the request  of:  [name], PA-C  CC: Bilateral hip and low back pain  [id number]  12/01/2021 Bilateral injections given today, tolerated the procedure well, discussed icing regimen and home exercises.  We discussed which activities to do and which ones to avoid, increase activity slowly.  Discussed icing regimen.  Follow-up with me again in 6 to 8 weeks still concerned that more of the pain is from lumbar radiculopathy and may need to consider another epidural for patient's severe spinal stenosis at L4-L5     I am concerned that patient is actually having more of the spinal stenosis with radiculopathy and neurogenic claudication that could be contributing to some of her pain and leg pain.  On exam today the patient did have some hypoechoic changes on ultrasound and we did attempt a greater trochanteric injections.  Last ones were in February and hopefully that this will make improvement again.  We discussed with patient that she should continue on the gabapentin.  Can take it at night.  We will need to consider the possibility of changing medication of Zoloft to either Cymbalta or Effexor.  Also consider the possibility of epidurals if we need to.  Following up with me again in 6 to 8 weeks      Update 02/02/2022 Kaitlyn Carter is a 56 y.o. female coming in with complaint of B hip and LBP. Patient states that the injection she had from our office helped for about 2 days. Patient is back to not being able to roll over in bed and by 4pm patient is done for the day she can hardly walk.  Patient states that it has been so bad sometimes that it does stop her from all daily activities.  Sometimes severe enough affects her doing things with her family.  Patient would like to  consider the possibility of different medications.      Past Medical History:  Diagnosis Date   Anxiety    Arthritis    neck   Attention deficit disorder    Dyspareunia    Dysrhythmia    ..this was 17 yrs ago and was seen Dr Angelena Form   GERD (gastroesophageal reflux disease)    Headache(784.0)    due to neck - otc meds prn   Herpes simplex type 1 infection    PONV (postoperative nausea and vomiting)    Seasonal allergies    Sleep apnea    cpap   SVD (spontaneous vaginal delivery)    x 2   Urinary incontinence    Past Surgical History:  Procedure Laterality Date   ABDOMINAL HYSTERECTOMY  2001   [place] DECOMPRESSION/DISCECTOMY FUSION 4 LEVEL/HARDWARE REMOVAL N/A 11/16/2012   Procedure: Cervical Three-Four, Cervical Four-Five, Cervical Six-Seven, Cervical Seven-Thoracic One Anterior cervical decompression/diskectomy/fusion/possible removal of plate at E8-3;  Surgeon: Floyce Stakes, MD;  Location: [facility];  Service: Neurosurgery;  Laterality: N/A;  ANTERIOR CERVICAL DECOMPRESSION/DISCECTOMY FUSION 4 LEVEL/HARDWARE REMOVAL   AUGMENTATION MAMMAPLASTY  2002   BACK SURGERY     BREAST SURGERY     CERVICAL FUSION     c3-4   CYSTOSCOPY N/A 01/29/2013   Procedure: CYSTOSCOPY;  Surgeon: Reece Packer, MD;  Location: [facility];  Service: Urology;  Laterality: N/A;  INCONTINENCE SURGERY  03/2002   [facility] sling--Dr. Michaelle Birks in  [place] MET-RX N/A 02/10/2020   Procedure: Lumbar Four-Five  MIS laminectomy;  Surgeon: Judith Part, MD;  Location: [facility];  Service: Neurosurgery;  Laterality: N/A;  posterior   PUBOVAGINAL SLING N/A 01/29/2013   Procedure: REMOVAL OF SLING ; INSERTION OF XEROFORM GRAFT;  Surgeon: Reece Packer, MD;  Location: [facility];  Service: Urology;  Laterality: N/A;   WISDOM TOOTH EXTRACTION     Social History   Socioeconomic History   Marital status: Divorced     Spouse name: Not on file   Number of children: Not on file   Years of education: Not on file   Highest education level: Not on file  Occupational History   Not on file  Tobacco Use   Smoking status: Never   Smokeless tobacco: Never  Vaping Use   Vaping Use: Never used  Substance and Sexual Activity   Alcohol use: Yes    Alcohol/week: 0.0 standard drinks of alcohol    Comment: socially   Drug use: No   Sexual activity: Yes    Partners: Male    Birth control/protection: Surgical    Comment: TVH 2001  Other Topics Concern   Not on file  Social History Narrative   Not on file   Social Determinants of Health   Financial Resource Strain: Not on file  Food Insecurity: Not on file  Transportation Needs: Not on file  Physical Activity: Not on file  Stress: Not on file  Social Connections: Not on file   Allergies  Allergen Reactions   Other Swelling    Pt states she had previous reaction to a patch for nausea.  Pt states that it caused swelling   Adhesive [Tape] Rash   Latex Rash   Family History  Problem Relation Age of Onset   Depression Mother    Asthma Mother    Diabetes Father    Hypertension Father    Heart disease Father    Kidney disease Father    Heart failure Father    Breast cancer Maternal Grandmother    Hypertension Maternal Grandmother    Hypertension Maternal Grandfather    Hypertension Paternal Grandfather    Stroke Paternal Grandfather    CAD Paternal Uncle       Current Outpatient Medications (Respiratory):    levocetirizine (XYZAL) 5 MG tablet, Take 5 mg by mouth at bedtime.    Current Outpatient Medications (Other):    ALPRAZolam (XANAX) 0.5 MG tablet, Take 0.5 mg by mouth daily as needed for sleep.   amphetamine-dextroamphetamine (ADDERALL XR) 30 MG 24 hr capsule, Take 1 capsule by mouth daily as needed (ADHD).    gabapentin (NEURONTIN) 300 MG capsule, Take 1 capsule (300 mg total) by mouth 3 (three) times daily.   sertraline (ZOLOFT)  100 MG tablet, Take 100 mg by mouth daily.   venlafaxine XR (EFFEXOR XR) 37.5 MG 24 hr capsule, Take 1 capsule (37.5 mg total) by mouth daily with breakfast.   Reviewed prior external information including notes and imaging from  primary care provider As well as notes that were available from care everywhere and other healthcare systems.  Past medical history, social, surgical and family history all reviewed in electronic medical record.  No pertanent information unless stated regarding to the chief complaint.   Review of Systems:  No headache, visual changes, nausea, vomiting, diarrhea, constipation, dizziness, abdominal pain, skin rash,  fevers, chills, night sweats, weight loss, swollen lymph nodes, joint swelling, chest pain, shortness of breath, mood changes. POSITIVE muscle aches, body aches   Objective  Blood pressure 122/84, pulse 76, height _0  (1.651 m), weight 165 lb (74.8 kg), SpO2 98 %.   General: No apparent distress alert and oriented x3 mood and affect normal, dressed appropriately.  HEENT: Pupils equal, extraocular movements intact  Respiratory: Patient's speak in full sentences and does not appear short of breath  Cardiovascular: No lower extremity edema, non tender, no erythema  Low back exam does have some loss of lordosis.  Some tenderness to palpation in the paraspinal musculature.  Still having severe tenderness over the greater trochanteric area bilaterally.  This still seems to be out of proportion to the amount of palpation noted today.  Tightness with straight leg test.  Only 5 degrees of extension of the back noted.    Impression and Recommendations:     The above documentation has been reviewed and is accurate and complete Lyndal Pulley, DO

## 2022-02-02 ENCOUNTER — Ambulatory Visit (INDEPENDENT_AMBULATORY_CARE_PROVIDER_SITE_OTHER): Payer: Self-pay | Admitting: Family Medicine

## 2022-02-02 VITALS — BP 122/84 | HR 76 | Ht 65.0 in | Wt 165.0 lb

## 2022-02-02 DIAGNOSIS — M48062 Spinal stenosis, lumbar region with neurogenic claudication: Secondary | ICD-10-CM

## 2022-02-02 MED ORDER — VENLAFAXINE HCL ER 37.5 MG PO CP24: 37.5000 mg | ORAL_CAPSULE | Freq: Every day | ORAL | 1 refills | Status: DC

## 2022-02-02 NOTE — Patient Instructions (Addendum)
Good to see you  Effexor 37.5mg  sent to pharmacy  Discontinue other anti anxiety medication  Follow up in 4-6 weeks

## 2022-02-02 NOTE — Assessment & Plan Note (Signed)
Known significant spinal stenosis.  Worsening pain.  Does not feel back to anything we have tried so far has been beneficial.  We discussed the possibility of Effexor for this chronic problem and worsening symptoms.  Patient would like to start it as well.  Patient will have the change in the medication and see how patient responds.  Discussed with patient that worsening symptoms with not making significant improvement with the epidurals we also need to consider the possibility of to see if patient would be a candidate for medial branch block.  Patient will follow-up with me again in 4 to 6 weeks to see how patient is responding.

## 2022-03-03 NOTE — Progress Notes (Signed)
[place] Phone: [phone] Subjective:   Kaitlyn Carter, am serving as a scribe for Dr. Hulan Saas.  I'm seeing this patient by the request  of:  [name], PA-C  CC: Back pain and neck pain follow-up  [id number]  02/02/2022 Known significant spinal stenosis.  Worsening pain.  Does not feel back to anything we have tried so far has been beneficial.  We discussed the possibility of Effexor for this chronic problem and worsening symptoms.  Patient would like to start it as well.  Patient will have the change in the medication and see how patient responds.  Discussed with patient that worsening symptoms with not making significant improvement with the epidurals we also need to consider the possibility of to see if patient would be a candidate for medial branch block.  Patient will follow-up with me again in 4 to 6 weeks to see how patient is responding.     Update 03/10/2022 Kaitlyn Carter is a 57 y.o. female coming in with complaint of lumbar spine pain. Patient states that her back pain has improved greatly with the addition of effexor. She is now able to go to the gym. Does still have pain but it is much less.      Past Medical History:  Diagnosis Date   Anxiety    Arthritis    neck   Attention deficit disorder    Dyspareunia    Dysrhythmia    ..this was 17 yrs ago and was seen Dr Angelena Form   GERD (gastroesophageal reflux disease)    Headache(784.0)    due to neck - otc meds prn   Herpes simplex type 1 infection    PONV (postoperative nausea and vomiting)    Seasonal allergies    Sleep apnea    cpap   SVD (spontaneous vaginal delivery)    x 2   Urinary incontinence    Past Surgical History:  Procedure Laterality Date   ABDOMINAL HYSTERECTOMY  2001   [place] DECOMPRESSION/DISCECTOMY FUSION 4 LEVEL/HARDWARE REMOVAL N/A 11/16/2012   Procedure:  Cervical Three-Four, Cervical Four-Five, Cervical Six-Seven, Cervical Seven-Thoracic One Anterior cervical decompression/diskectomy/fusion/possible removal of plate at J5-0;  Surgeon: Floyce Stakes, MD;  Location: [facility];  Service: Neurosurgery;  Laterality: N/A;  ANTERIOR CERVICAL DECOMPRESSION/DISCECTOMY FUSION 4 LEVEL/HARDWARE REMOVAL   AUGMENTATION MAMMAPLASTY  2002   BACK SURGERY     BREAST SURGERY     CERVICAL FUSION     c3-4   CYSTOSCOPY N/A 01/29/2013   Procedure: CYSTOSCOPY;  Surgeon: Reece Packer, MD;  Location: [facility];  Service: Urology;  Laterality: N/A;   INCONTINENCE SURGERY  03/2002   [facility] sling--Dr. Michaelle Birks in  [place] MET-RX N/A 02/10/2020   Procedure: Lumbar Four-Five  MIS laminectomy;  Surgeon: Judith Part, MD;  Location: [place];  Service: Neurosurgery;  Laterality: N/A;  posterior   PUBOVAGINAL SLING N/A 01/29/2013   Procedure: REMOVAL OF SLING ; INSERTION OF XEROFORM GRAFT;  Surgeon: Reece Packer, MD;  Location: [facility];  Service: Urology;  Laterality: N/A;   WISDOM TOOTH EXTRACTION     Social History   Socioeconomic History   Marital status: Divorced    Spouse name: Not on file   Number of children: Not on file   Years of education: Not on file   Highest education level: Not on  file  Occupational History   Not on file  Tobacco Use   Smoking status: Never   Smokeless tobacco: Never  Vaping Use   Vaping Use: Never used  Substance and Sexual Activity   Alcohol use: Yes    Alcohol/week: 0.0 standard drinks of alcohol    Comment: socially   Drug use: Carter   Sexual activity: Yes    Partners: Male    Birth control/protection: Surgical    Comment: [facility] 2001  Other Topics Concern   Not on file  Social History Narrative   Not on file   Social Determinants of Health   Financial Resource Strain: Not on file  Food Insecurity: Not on file  Transportation Needs: Not on file  Physical  Activity: Not on file  Stress: Not on file  Social Connections: Not on file   Allergies  Allergen Reactions   Other Swelling    Pt states she had previous reaction to a patch for nausea.  Pt states that it caused swelling   Adhesive [Tape] Rash   Latex Rash   Family History  Problem Relation Age of Onset   Depression Mother    Asthma Mother    Diabetes Father    Hypertension Father    Heart disease Father    Kidney disease Father    Heart failure Father    Breast cancer Maternal Grandmother    Hypertension Maternal Grandmother    Hypertension Maternal Grandfather    Hypertension Paternal Grandfather    Stroke Paternal Grandfather    CAD Paternal Uncle          Current Outpatient Medications (Other):    amphetamine-dextroamphetamine (ADDERALL XR) 30 MG 24 hr capsule, Take 1 capsule by mouth daily as needed (ADHD).    venlafaxine XR (EFFEXOR XR) 75 MG 24 hr capsule, Take 1 capsule (75 mg total) by mouth daily with breakfast.   Reviewed prior external information including notes and imaging from  primary care provider As well as notes that were available from care everywhere and other healthcare systems.  Past medical history, social, surgical and family history all reviewed in electronic medical record.  Carter pertanent information unless stated regarding to the chief complaint.   Review of Systems:  Carter headache, visual changes, nausea, vomiting, diarrhea, constipation, dizziness, abdominal pain, skin rash, fevers, chills, night sweats, weight loss, swollen lymph nodes, body aches, joint swelling, chest pain, shortness of breath, mood changes. POSITIVE muscle aches  Objective  Blood pressure 116/84, pulse (!) 106, height _0  (1.651 m), weight 176 lb (79.8 kg), SpO2 99 %.   General: Carter apparent distress alert and oriented x3 mood and affect normal, dressed appropriately.  HEENT: Pupils equal, extraocular movements intact  Respiratory: Patient's speak in full sentences  and does not appear short of breath  Cardiovascular: Carter lower extremity edema, non tender, Carter erythema  Low back Still has some tightness noted but Carter significant radicular symptoms.  Still has some limited extension of the back by 5 degrees.  Tightness with FABER test noted.    Impression and Recommendations:     The above documentation has been reviewed and is accurate and complete Lyndal Pulley, DO

## 2022-03-10 ENCOUNTER — Ambulatory Visit: Payer: 59 | Admitting: Family Medicine

## 2022-03-10 VITALS — BP 116/84 | HR 106 | Ht 65.0 in | Wt 176.0 lb

## 2022-03-10 DIAGNOSIS — M545 Low back pain, unspecified: Secondary | ICD-10-CM

## 2022-03-10 DIAGNOSIS — M48062 Spinal stenosis, lumbar region with neurogenic claudication: Secondary | ICD-10-CM | POA: Diagnosis not present

## 2022-03-10 DIAGNOSIS — G8929 Other chronic pain: Secondary | ICD-10-CM

## 2022-03-10 MED ORDER — VENLAFAXINE HCL ER 75 MG PO CP24: 75.0000 mg | ORAL_CAPSULE | Freq: Every day | ORAL | 1 refills | Status: DC

## 2022-03-10 NOTE — Assessment & Plan Note (Signed)
Patient has done well overall.  Still has some tightness noted.  Responded extremely well though to osteopathic manipulation at the moment.  We did increase patient Effexor to 75 mg.  Will increase home exercises and icing regimen.  Patient will follow-up again in 2 months for further evaluation and management .

## 2022-03-10 NOTE — Assessment & Plan Note (Signed)
Once again has done well with the Effexor but feels still some mild radicular symptoms intermittently and will increase Effexor to 75 mg to try to help even more.  Patient will continue to monitor the weight gain.  He is working on a more regular basis.

## 2022-03-10 NOTE — Patient Instructions (Signed)
Great to see you as always, increase Effexor to 75 mg and lets see if this helps even more.   Follow-up with me again 7 to 8 weeks Happy New Year!

## 2022-03-17 DIAGNOSIS — F5104 Psychophysiologic insomnia: Secondary | ICD-10-CM | POA: Diagnosis not present

## 2022-03-17 DIAGNOSIS — F988 Other specified behavioral and emotional disorders with onset usually occurring in childhood and adolescence: Secondary | ICD-10-CM | POA: Diagnosis not present

## 2022-03-17 DIAGNOSIS — N951 Menopausal and female climacteric states: Secondary | ICD-10-CM | POA: Diagnosis not present

## 2022-03-17 DIAGNOSIS — F332 Major depressive disorder, recurrent severe without psychotic features: Secondary | ICD-10-CM | POA: Diagnosis not present

## 2022-03-17 DIAGNOSIS — F411 Generalized anxiety disorder: Secondary | ICD-10-CM | POA: Diagnosis not present

## 2022-03-17 DIAGNOSIS — R2231 Localized swelling, mass and lump, right upper limb: Secondary | ICD-10-CM | POA: Diagnosis not present

## 2022-03-25 DIAGNOSIS — Z8739 Personal history of other diseases of the musculoskeletal system and connective tissue: Secondary | ICD-10-CM | POA: Diagnosis not present

## 2022-03-25 DIAGNOSIS — G4733 Obstructive sleep apnea (adult) (pediatric): Secondary | ICD-10-CM | POA: Diagnosis not present

## 2022-04-12 ENCOUNTER — Other Ambulatory Visit: Payer: Self-pay | Admitting: Family Medicine

## 2022-04-27 ENCOUNTER — Telehealth: Payer: Self-pay | Admitting: Pulmonary Disease

## 2022-04-27 ENCOUNTER — Telehealth (HOSPITAL_BASED_OUTPATIENT_CLINIC_OR_DEPARTMENT_OTHER): Payer: Self-pay | Admitting: Pulmonary Disease

## 2022-04-27 NOTE — Telephone Encounter (Signed)
[facility] calling they received the fax but need you signature on the paperwork there Fax#[phone]

## 2022-04-27 NOTE — Telephone Encounter (Signed)
Spoke with [facility] they advise they need patients sleep study from 2016. Verified fax number, will get sleep study faxed over today. Nothing further needed.

## 2022-04-27 NOTE — Telephone Encounter (Signed)
Refer to separate encounter.

## 2022-04-28 NOTE — Progress Notes (Signed)
Kaitlyn Carter [address] [place] Phone: [phone] Subjective:   IVilma Meckel, am serving as a scribe for Dr. Hulan Saas.  I'm seeing this patient by the request  of:  [name], PA-C  CC: Back pain and buttocks pain  [id number]  03/10/2022 Once again has done well with the Effexor but feels still some mild radicular symptoms intermittently and will increase Effexor to 75 mg to try to help even more.  Patient will continue to monitor the weight gain.  He is working on a more regular basis.      Patient has done well overall.  Still has some tightness noted.  Responded extremely well though to osteopathic manipulation at the moment.  We did increase patient Effexor to 75 mg.  Will increase home exercises and icing regimen.  Patient will follow-up again in 2 months for further evaluation and management .      Update 05/03/2022 Kaitlyn Carter is a 57 y.o. female coming in with complaint of lumbar and thoracic spine pain. Patient states R leg has increase in numbness but a decrease in pain. Everything else the same.      Past Medical History:  Diagnosis Date   Anxiety    Arthritis    neck   Attention deficit disorder    Dyspareunia    Dysrhythmia    ..this was 17 yrs ago and was seen Dr Angelena Form   GERD (gastroesophageal reflux disease)    Headache(784.0)    due to neck - otc meds prn   Herpes simplex type 1 infection    PONV (postoperative nausea and vomiting)    Seasonal allergies    Sleep apnea    cpap   SVD (spontaneous vaginal delivery)    x 2   Urinary incontinence    Past Surgical History:  Procedure Laterality Date   ABDOMINAL HYSTERECTOMY  2001   [place] DECOMPRESSION/DISCECTOMY FUSION 4 LEVEL/HARDWARE REMOVAL N/A 11/16/2012   Procedure: Cervical Three-Four, Cervical Four-Five, Cervical Six-Seven, Cervical Seven-Thoracic One Anterior cervical  decompression/diskectomy/fusion/possible removal of plate at 075-GRM;  Surgeon: Floyce Stakes, MD;  Location: [facility];  Service: Neurosurgery;  Laterality: N/A;  ANTERIOR CERVICAL DECOMPRESSION/DISCECTOMY FUSION 4 LEVEL/HARDWARE REMOVAL   AUGMENTATION MAMMAPLASTY  2002   BACK SURGERY     BREAST SURGERY     CERVICAL FUSION     c3-4   CYSTOSCOPY N/A 01/29/2013   Procedure: CYSTOSCOPY;  Surgeon: Reece Packer, MD;  Location: [facility];  Service: Urology;  Laterality: N/A;   INCONTINENCE SURGERY  03/2002   [facility] sling--Dr. Michaelle Birks in  [place] MET-RX N/A 02/10/2020   Procedure: Lumbar Four-Five  MIS laminectomy;  Surgeon: Judith Part, MD;  Location: [place];  Service: Neurosurgery;  Laterality: N/A;  posterior   PUBOVAGINAL SLING N/A 01/29/2013   Procedure: REMOVAL OF SLING ; INSERTION OF XEROFORM GRAFT;  Surgeon: Reece Packer, MD;  Location: [facility];  Service: Urology;  Laterality: N/A;   WISDOM TOOTH EXTRACTION     Social History   Socioeconomic History   Marital status: Divorced    Spouse name: Not on file   Number of children: Not on file   Years of education: Not on file   Highest education level: Not on file  Occupational History   Not on file  Tobacco Use   Smoking status: Never   Smokeless  tobacco: Never  Vaping Use   Vaping Use: Never used  Substance and Sexual Activity   Alcohol use: Yes    Alcohol/week: 0.0 standard drinks of alcohol    Comment: socially   Drug use: No   Sexual activity: Yes    Partners: Male    Birth control/protection: Surgical    Comment: TVH 2001  Other Topics Concern   Not on file  Social History Narrative   Not on file   Social Determinants of Health   Financial Resource Strain: Not on file  Food Insecurity: Not on file  Transportation Needs: Not on file  Physical Activity: Not on file  Stress: Not on file  Social Connections: Not on file   Allergies  Allergen  Reactions   Other Swelling    Pt states she had previous reaction to a patch for nausea.  Pt states that it caused swelling   Adhesive [Tape] Rash   Latex Rash   Family History  Problem Relation Age of Onset   Depression Mother    Asthma Mother    Diabetes Father    Hypertension Father    Heart disease Father    Kidney disease Father    Heart failure Father    Breast cancer Maternal Grandmother    Hypertension Maternal Grandmother    Hypertension Maternal Grandfather    Hypertension Paternal Grandfather    Stroke Paternal Grandfather    CAD Paternal Uncle          Current Outpatient Medications (Other):    amphetamine-dextroamphetamine (ADDERALL XR) 30 MG 24 hr capsule, Take 1 capsule by mouth daily as needed (ADHD).    venlafaxine XR (EFFEXOR XR) 75 MG 24 hr capsule, Take 1 capsule (75 mg total) by mouth daily with breakfast.   Reviewed prior external information including notes and imaging from  primary care provider As well as notes that were available from care everywhere and other healthcare systems.  Past medical history, social, surgical and family history all reviewed in electronic medical record.  No pertanent information unless stated regarding to the chief complaint.   Review of Systems:  No headache, visual changes, nausea, vomiting, diarrhea, constipation, dizziness, abdominal pain, skin rash, fevers, chills, night sweats, weight loss, swollen lymph nodes, body aches, joint swelling, chest pain, shortness of breath, mood changes. POSITIVE muscle aches  Objective  Blood pressure 110/76, pulse 90, height '5\' 5"'$  (1.651 m), SpO2 96 %.   General: No apparent distress alert and oriented x3 mood and affect normal, dressed appropriately.  HEENT: Pupils equal, extraocular movements intact  Respiratory: Patient's speak in full sentences and does not appear short of breath  Cardiovascular: No lower extremity edema, non tender, no erythema  Low back does have some  loss of lordosis.  Positive straight leg test with radicular symptoms on the right greater than left.  Tenderness to palpation over the greater trochanteric area bilaterally.  Weakness noted with dorsi flexion of the foot 3 out of 5 right compared to left    Impression and Recommendations:    The above documentation has been reviewed and is accurate and complete Lyndal Pulley, DO

## 2022-05-03 ENCOUNTER — Encounter: Payer: Self-pay | Admitting: Family Medicine

## 2022-05-03 ENCOUNTER — Ambulatory Visit: Payer: 59 | Admitting: Family Medicine

## 2022-05-03 VITALS — BP 110/76 | HR 90 | Ht 65.0 in

## 2022-05-03 DIAGNOSIS — M545 Low back pain, unspecified: Secondary | ICD-10-CM

## 2022-05-03 DIAGNOSIS — M48062 Spinal stenosis, lumbar region with neurogenic claudication: Secondary | ICD-10-CM

## 2022-05-03 DIAGNOSIS — M25552 Pain in left hip: Secondary | ICD-10-CM | POA: Diagnosis not present

## 2022-05-03 DIAGNOSIS — M25551 Pain in right hip: Secondary | ICD-10-CM | POA: Diagnosis not present

## 2022-05-03 NOTE — Patient Instructions (Signed)
MRI [facility] Imaging [id number] See me again in 7 weeks

## 2022-05-03 NOTE — Assessment & Plan Note (Signed)
Patient is having worsening pain again.  Difficult to assess secondary to patient having severe spinal stenosis versus the sacroiliac joint dysfunction.  Patient is having more gluteal pain that seems to be out of proportion as well.  Affecting daily activities and is having some mild atrophy of the musculature of the gluteal area.  Do feel that MRI is warranted to further evaluate for possible epidurals of the back or see if there is any other pelvic pathology that could be contributing.  This is affected daily activities, including her sleep.  Starting to affect her mood and psychological aspect as well.  Patient will get the advanced imaging and will discuss further with her about the next treatment options.

## 2022-05-09 ENCOUNTER — Telehealth: Payer: Self-pay | Admitting: Pulmonary Disease

## 2022-05-09 NOTE — Telephone Encounter (Signed)
They have requested the Sleep Study three times and we have sent it over but it is not signed by the provider. Need a signed copy. (They tried to order the HSE Equip but the DME Co[employer] And PT ins denied the sleep study so they are stuck. )  Please refax to [phone]  Attn: Sabrina  Provider: Dr. Ermalene Postin

## 2022-05-10 NOTE — Telephone Encounter (Signed)
Sleep study printed and signed by Dr. Elsworth Soho and faxed to provided fax number in Sabrina's attn. Nothing further needed.

## 2022-05-10 NOTE — Telephone Encounter (Signed)
Routing this message to Raquel Sarna as she is working with Dr. Elsworth Soho at [place].

## 2022-05-22 ENCOUNTER — Other Ambulatory Visit: Payer: 59

## 2022-05-26 ENCOUNTER — Ambulatory Visit
Admission: RE | Admit: 2022-05-26 | Discharge: 2022-05-26 | Disposition: A | Discharge status: Home / Self Care | Payer: 59 | Source: Ambulatory Visit | Attending: Family Medicine | Admitting: Family Medicine

## 2022-05-26 DIAGNOSIS — M545 Low back pain, unspecified: Secondary | ICD-10-CM

## 2022-05-26 DIAGNOSIS — M25551 Pain in right hip: Secondary | ICD-10-CM

## 2022-05-26 DIAGNOSIS — S76312A Strain of muscle, fascia and tendon of the posterior muscle group at thigh level, left thigh, initial encounter: Secondary | ICD-10-CM | POA: Diagnosis not present

## 2022-05-26 DIAGNOSIS — M79604 Pain in right leg: Secondary | ICD-10-CM | POA: Diagnosis not present

## 2022-05-26 DIAGNOSIS — M5127 Other intervertebral disc displacement, lumbosacral region: Secondary | ICD-10-CM | POA: Diagnosis not present

## 2022-05-26 DIAGNOSIS — M160 Bilateral primary osteoarthritis of hip: Secondary | ICD-10-CM | POA: Diagnosis not present

## 2022-05-26 DIAGNOSIS — M48061 Spinal stenosis, lumbar region without neurogenic claudication: Secondary | ICD-10-CM | POA: Diagnosis not present

## 2022-06-01 ENCOUNTER — Encounter: Payer: Self-pay | Admitting: Primary Care

## 2022-06-01 ENCOUNTER — Ambulatory Visit (INDEPENDENT_AMBULATORY_CARE_PROVIDER_SITE_OTHER): Payer: 59 | Admitting: Primary Care

## 2022-06-01 VITALS — BP 104/72 | HR 102 | Temp 98.0°F | Ht 65.0 in | Wt 168.0 lb

## 2022-06-01 DIAGNOSIS — G4733 Obstructive sleep apnea (adult) (pediatric): Secondary | ICD-10-CM

## 2022-06-01 DIAGNOSIS — F988 Other specified behavioral and emotional disorders with onset usually occurring in childhood and adolescence: Secondary | ICD-10-CM | POA: Diagnosis not present

## 2022-06-01 NOTE — Patient Instructions (Addendum)
Since you are still having witnessed apnea/gasping and choking symptoms while using CPAP recommend having you go into the sleep lab for CPAP titration study to determine best pressure settings   It would also be helpful if you could bring your CPAP by DME store to see if they can get a download from your machine. We were not able to get data from [facility] due to 3G cut off.   Orders: CPAP titration study in-lab    Follow-up: 3 months with Beth NP for compliance check

## 2022-06-01 NOTE — Progress Notes (Signed)
@Patient  ID: Kaitlyn Carter, female    DOB: 15-Jan-1966, 57 y.o.   MRN: [id number]  Chief Complaint  Patient presents with   Consult    Snoring, gasping for air during night x 2 years.    Referring provider: Roderic Ovens Christin*  HPI: 57 year old female. Never smoked. PMH significant for OSA, asthmatic bronchitis, reactive airway disease, seasonal allergies, GERD, ADD, depression, GAD, s/p hysterectomy.   06/01/2022 Patient presents today for overdue OSA follow-up. Previous patient of Dr. Elsworth Soho, last seen in our office by pulmonary NP on 11/12/2019. HST on 01/13/2015 showed mild OSA, AHI 13.4/hour.   She reports continued compliance with CPAP, download not available d/t 3G cut off. She recently went on a cruise and family friend who is a Music therapist told her she stops breathing in her sleep. She will wake up gasping/choking despite compliance with CPAP. She uses full face mask. She sleeps on her side primarily. She is requiring naps throughout the day. CPAP machine is 56 years old   Sleep questionnaire Symptoms-  snoring, gasping/choking and witnessed apnea  Prior sleep study- 2016 Bedtime- 11pm Time to fall asleep- <5 mins  Nocturnal awakenings- several times Out of bed/start of day- 9am  Weight changes- +/- 20 lbs  Do you operate heavy machinery- no Do you currently wear CPAP- yes, auto 5-15cm h20  Do you current wear oxygen- no  Epworth- 13 Medication - adderall 30mg  and Effexor 75mg    Allergies  Allergen Reactions   Other Swelling    Pt states she had previous reaction to a patch for nausea.  Pt states that it caused swelling   Adhesive [Tape] Rash   Latex Rash    Immunization History  Administered Date(s) Administered   Influenza,inj,Quad PF,6+ Mos 11/12/2019   PFIZER(Purple Top)SARS-COV-2 Vaccination 09/26/2019, 10/17/2019    Past Medical History:  Diagnosis Date   Anxiety    Arthritis    neck   Attention deficit disorder    Dyspareunia     Dysrhythmia    ..this was 17 yrs ago and was seen Dr Angelena Form   GERD (gastroesophageal reflux disease)    Headache(784.0)    due to neck - otc meds prn   Herpes simplex type 1 infection    PONV (postoperative nausea and vomiting)    Seasonal allergies    Sleep apnea    cpap   SVD (spontaneous vaginal delivery)    x 2   Urinary incontinence     Tobacco History: Social History   Tobacco Use  Smoking Status Never  Smokeless Tobacco Never   Counseling given: Not Answered   Outpatient Medications Prior to Visit  Medication Sig Dispense Refill   venlafaxine XR (EFFEXOR XR) 75 MG 24 hr capsule Take 1 capsule (75 mg total) by mouth daily with breakfast. 90 capsule 1   amphetamine-dextroamphetamine (ADDERALL XR) 30 MG 24 hr capsule Take 1 capsule by mouth daily as needed (ADHD).  (Patient not taking: Reported on 06/01/2022)  0   No facility-administered medications prior to visit.   Review of Systems  Review of Systems  Constitutional:  Positive for fatigue.  HENT: Negative.    Respiratory:  Positive for apnea.   Psychiatric/Behavioral:  Positive for sleep disturbance.    Physical Exam  BP 104/72 (BP Location: Left Arm, Patient Position: Sitting, Cuff Size: Normal)   Pulse (!) 102   Temp 98 F (36.7 C) (Oral)   Ht 5\' 5"  (1.651 m)   Wt 168 lb (76.2  kg)   SpO2 98%   BMI 27.96 kg/m  Physical Exam Constitutional:      Appearance: Normal appearance. She is normal weight. She is not ill-appearing.  HENT:     Head: Normocephalic and atraumatic.     Mouth/Throat:     Mouth: Mucous membranes are moist.     Pharynx: Oropharynx is clear.  Cardiovascular:     Rate and Rhythm: Normal rate and regular rhythm.  Pulmonary:     Effort: Pulmonary effort is normal.     Breath sounds: Normal breath sounds.  Musculoskeletal:        General: Normal range of motion.  Skin:    General: Skin is warm and dry.  Neurological:     General: No focal deficit present.     Mental Status:  She is alert and oriented to person, place, and time. Mental status is at baseline.  Psychiatric:        Mood and Affect: Mood normal.        Behavior: Behavior normal.        Thought Content: Thought content normal.        Judgment: Judgment normal.      Lab Results:  CBC    Component Value Date/Time   WBC 9.8 04/20/2021 1425   RBC 4.82 04/20/2021 1425   HGB 14.8 04/20/2021 1425   HCT 43.9 04/20/2021 1425   PLT 365 04/20/2021 1425   MCV 91.1 04/20/2021 1425   MCH 30.7 04/20/2021 1425   MCHC 33.7 04/20/2021 1425   RDW 12.7 04/20/2021 1425   LYMPHSABS 2.3 04/20/2021 1425   MONOABS 0.7 04/20/2021 1425   EOSABS 0.1 04/20/2021 1425   BASOSABS 0.0 04/20/2021 1425    BMET    Component Value Date/Time   NA 136 04/20/2021 1425   K 4.1 04/20/2021 1425   CL 104 04/20/2021 1425   CO2 23 04/20/2021 1425   GLUCOSE 116 (H) 04/20/2021 1425   BUN 14 04/20/2021 1425   CREATININE 0.74 04/20/2021 1425   CREATININE 0.87 07/06/2016 1631   CALCIUM 9.5 04/20/2021 1425   GFRNONAA >60 04/20/2021 1425   GFRAA >60 03/08/2015 0038    BNP No results found for: "BNP"  ProBNP No results found for: "PROBNP"  Imaging: MR PELVIS WO CONTRAST  Result Date: 05/28/2022 CLINICAL DATA:  Low back pain and right leg pain for 3 years. No known injury. EXAM: MRI PELVIS WITHOUT CONTRAST TECHNIQUE: Multiplanar multisequence MR imaging of the pelvis was performed. No intravenous contrast was administered. COMPARISON:  MRI lumbar spine 05/26/2022 FINDINGS: Bones: No hip fracture, dislocation or avascular necrosis. No periosteal reaction or bone destruction. No aggressive osseous lesion. Normal sacrum and sacroiliac joints. No SI joint widening or erosive changes. Diffuse lumbar spine spondylosis partially visualized and better evaluated on the MRI of the lumbar spine performed earlier same day and reported separately. At L5-S1 there is a central disc protrusion, severe spinal stenosis and impingement of  bilateral S1 nerve roots. Articular cartilage and labrum Articular cartilage: Partial-thickness cartilage loss of the femoral head and acetabulum bilaterally. Labrum: Grossly intact, but evaluation is limited by lack of intraarticular fluid. Joint or bursal effusion Joint effusion:  No hip joint effusion.  No SI joint effusion. Bursae:  No bursal fluid. Muscles and tendons Flexors: Normal. Extensors: Normal. Abductors: Normal. Adductors: Normal. Gluteals: Small partial-thickness tear of the right gluteus minimus tendon. Mild tendinosis of the left gluteus minimus tendon. Hamstrings: Small interstitial tear of the left hamstring origin. Other findings  No pelvic free fluid. No fluid collection or hematoma. No inguinal lymphadenopathy. No inguinal hernia. IMPRESSION: 1. No hip fracture, dislocation or avascular necrosis. 2. Mild osteoarthritis of bilateral hips. 3. Diffuse lumbar spine spondylosis partially visualized and better evaluated on the MRI of the lumbar spine performed earlier same day and reported separately. At L5-S1 there is a central disc protrusion, severe spinal stenosis and impingement of bilateral S1 nerve roots. 4. Small partial-thickness tear of the right gluteus minimus tendon. Mild tendinosis of the left gluteus minimus tendon. Electronically Signed   By: Kathreen Devoid M.D.   On: 05/28/2022 08:39   MR Lumbar Spine Wo Contrast  Result Date: 05/28/2022 CLINICAL DATA:  Lumbar spine pain EXAM: MRI LUMBAR SPINE WITHOUT CONTRAST TECHNIQUE: Multiplanar, multisequence MR imaging of the lumbar spine was performed. No intravenous contrast was administered. COMPARISON:  06/12/2019 FINDINGS: Segmentation:  Standard. Alignment: Straightening lumbar lordosis with mild L2-3 and L4-5 anterolisthesis Vertebrae: No fracture, evidence of discitis, or bone lesion. Chronic L3 limbus vertebra. Conus medullaris and cauda equina: Conus extends to the L1-2 level. Conus and cauda equina appear normal. Paraspinal and  other soft tissues: Negative for perispinal mass or inflammation. Left renal cyst measuring 3 cm. Disc levels: T12- L1: Disc bulging with small right paracentral protrusion. Mild facet spurring. L1-L2: Moderate degenerative facet spurring. L2-L3: The disc is narrowed and bulging with broad central protrusion. Degenerative facet spurring asymmetric to left. Moderate spinal stenosis. Mild borderline moderate left foraminal narrowing. L3-L4: Mild annulus bulging. L4-L5: Degenerative facet spurring with anterolisthesis. 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 Disc height loss and bulging. Improved spinal canal patency. Patent foramina L5-S1:Disc narrowing and bulging with central protrusion. Facet spurring and ligamentum flavum thickening. Advanced spinal stenosis. Bilateral S1 impingement that is progressed. IMPRESSION: 1. Interval L4-5 decompression with resolved spinal stenosis. 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 2. L5-S1 progressive disc protrusion impinging on the descending S1 nerve roots. Moderate spinal stenosis at this level. 3. L2-3 chronic moderate spinal stenosis. Electronically Signed   By: Jorje Guild M.D.   On: 05/28/2022 07:49     Assessment & Plan:   OSA (obstructive sleep apnea) -  HST on 01/13/2015 showed mild OSA, AHI 13.4/hour. Maintained on auto CPAP 5-15cm h20. She reports compliance with use, airview download unavailable d/t 3G cut off. She continues to have witnessed apnea and reports waking up gasping/choking at night despite wearing her CPAP at night. She requiring daily naps. Needs in-lab CPAP titration study to assess optimal pressure setting to eliminate apneas. Encourage patient get a wedge pillow to elevate her head at night 30 degrees. Advised she avoid alcohol/sedative prior to bedtime and cautioned against driving if experiencing excessive daytime sleepiness. Follow-up by telephone after sleep study and in 3 months for compliance check.       Martyn Ehrich, NP 06/01/2022

## 2022-06-01 NOTE — Assessment & Plan Note (Signed)
Continue Adderall 30 mg daily

## 2022-06-01 NOTE — Assessment & Plan Note (Addendum)
-    HST on 01/13/2015 showed mild OSA, AHI 13.4/hour. Maintained on auto CPAP 5-15cm h20. She reports compliance with use, airview download unavailable d/t 3G cut off. She continues to have witnessed apnea and reports waking up gasping/choking despite wearing her CPAP at night. She requiring daily naps. Needs in-lab CPAP titration study to assess optimal pressure setting to eliminate apneas. Encourage patient get a wedge pillow to elevate her head at night 30 degrees. Advised she avoid alcohol/sedative prior to bedtime and cautioned against driving if experiencing excessive daytime sleepiness. Follow-up by telephone after sleep study and in 3 months for compliance check.

## 2022-06-13 ENCOUNTER — Encounter: Payer: Self-pay | Admitting: *Deleted

## 2022-06-22 NOTE — Progress Notes (Unsigned)
[facility] [address] [place] [address] Phone: [phone] Subjective:   Bruce Donath, am serving as a scribe for Dr. Antoine Primas.  I'm seeing this patient by the request  of:  Aileen Fass, PA-C  CC: bilateral hip pain, right hip pain   NGE:[id number]  05/03/2022 Patient is having worsening pain again.  Difficult to assess secondary to patient having severe spinal stenosis versus the sacroiliac joint dysfunction.  Patient is having more gluteal pain that seems to be out of proportion as well.  Affecting daily activities and is having some mild atrophy of the musculature of the gluteal area.  Do feel that MRI is warranted to further evaluate for possible epidurals of the back or see if there is any other pelvic pathology that could be contributing.  This is affected daily activities, including her sleep.  Starting to affect her mood and psychological aspect as well.  Patient will get the advanced imaging and will discuss further with her about the next treatment options.     Update 06/23/2022 Kimmberly Damara Klunder is a 57 y.o. female coming in with complaint of B hip and lumbar spine pain. Patient states that she has constant pain in R glute that raidates down to her foot.   MRI lumbar 05/26/2022 IMPRESSION: 1. Interval L4-5 decompression with resolved spinal stenosis. 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 2. L5-S1 progressive disc protrusion impinging on the descending S1 nerve roots. Moderate spinal stenosis at this level. 3. L2-3 chronic moderate spinal stenosis.  MRI pelvis 05/26/2022 IMPRESSION: 1. No hip fracture, dislocation or avascular necrosis. 2. Mild osteoarthritis of bilateral hips. 3. Diffuse lumbar spine spondylosis partially visualized and better evaluated on the MRI of the lumbar spine performed earlier same day and reported separately. At L5-S1 there is a central disc protrusion, severe spinal  stenosis and impingement of bilateral S1 nerve roots. 4. Small partial-thickness tear of the right gluteus minimus tendon. Mild tendinosis of the left gluteus minimus tendon.     Past Medical History:  Diagnosis Date   Anxiety    Arthritis    neck   Attention deficit disorder    Dyspareunia    Dysrhythmia    ..this was 17 yrs ago and was seen Dr Clifton James   GERD (gastroesophageal reflux disease)    Headache(784.0)    due to neck - otc meds prn   Herpes simplex type 1 infection    PONV (postoperative nausea and vomiting)    Seasonal allergies    Sleep apnea    cpap   SVD (spontaneous vaginal delivery)    x 2   Urinary incontinence    Past Surgical History:  Procedure Laterality Date   ABDOMINAL HYSTERECTOMY  2001   [facility]-Dr. Miguel Aschoff   ANTERIOR CERVICAL DECOMPRESSION/DISCECTOMY FUSION 4 LEVEL/HARDWARE REMOVAL N/A 11/16/2012   Procedure: Cervical Three-Four, Cervical Four-Five, Cervical Six-Seven, Cervical Seven-Thoracic One Anterior cervical decompression/diskectomy/fusion/possible removal of plate at W1-0;  Surgeon: Karn Cassis, MD;  Location: [facility];  Service: Neurosurgery;  Laterality: N/A;  ANTERIOR CERVICAL DECOMPRESSION/DISCECTOMY FUSION 4 LEVEL/HARDWARE REMOVAL   AUGMENTATION MAMMAPLASTY  2002   BACK SURGERY     BREAST SURGERY     CERVICAL FUSION     c3-4   CYSTOSCOPY N/A 01/29/2013   Procedure: CYSTOSCOPY;  Surgeon: Martina Sinner, MD;  Location: [facility];  Service: Urology;  Laterality: N/A;   INCONTINENCE SURGERY  03/2002   [facility] sling--Dr. Elige Radon  [place]  [employer]   LUMBAR LAMINECTOMY/ DECOMPRESSION WITH MET-RX N/A 02/10/2020   Procedure: Lumbar Four-Five  MIS laminectomy;  Surgeon: Jadene Pierini, MD;  Location: [facility] OR;  Service: Neurosurgery;  Laterality: N/A;  posterior   PUBOVAGINAL SLING N/A 01/29/2013   Procedure: REMOVAL OF SLING ; INSERTION OF XEROFORM GRAFT;  Surgeon: Martina Sinner, MD;  Location: [facility];  Service: Urology;   Laterality: N/A;   WISDOM TOOTH EXTRACTION     Social History   Socioeconomic History   Marital status: Divorced    Spouse name: Not on file   Number of children: Not on file   Years of education: Not on file   Highest education level: Not on file  Occupational History   Not on file  Tobacco Use   Smoking status: Never   Smokeless tobacco: Never  Vaping Use   Vaping Use: Never used  Substance and Sexual Activity   Alcohol use: Yes    Alcohol/week: 0.0 standard drinks of alcohol    Comment: socially   Drug use: No   Sexual activity: Yes    Partners: Male    Birth control/protection: Surgical    Comment: [facility] 2001  Other Topics Concern   Not on file  Social History Narrative   Not on file   Social Determinants of Health   Financial Resource Strain: Not on file  Food Insecurity: Not on file  Transportation Needs: Not on file  Physical Activity: Not on file  Stress: Not on file  Social Connections: Not on file   Allergies  Allergen Reactions   Other Swelling    Pt states she had previous reaction to a patch for nausea.  Pt states that it caused swelling   Adhesive [Tape] Rash   Latex Rash   Family History  Problem Relation Age of Onset   Depression Mother    Asthma Mother    Diabetes Father    Hypertension Father    Heart disease Father    Kidney disease Father    Heart failure Father    Breast cancer Maternal Grandmother    Hypertension Maternal Grandmother    Hypertension Maternal Grandfather    Hypertension Paternal Grandfather    Stroke Paternal Grandfather    CAD Paternal Uncle          Current Outpatient Medications (Other):    amphetamine-dextroamphetamine (ADDERALL XR) 30 MG 24 hr capsule, Take 1 capsule by mouth daily as needed (ADHD).   venlafaxine XR (EFFEXOR XR) 75 MG 24 hr capsule, Take 1 capsule (75 mg total) by mouth daily with breakfast.   Reviewed prior external information including notes and imaging from  primary care  provider As well as notes that were available from care everywhere and other healthcare systems.  Past medical history, social, surgical and family history all reviewed in electronic medical record.  No pertanent information unless stated regarding to the chief complaint.   Review of Systems:  No headache, visual changes, nausea, vomiting, diarrhea, constipation, dizziness, abdominal pain, skin rash, fevers, chills, night sweats, weight loss, swollen lymph nodes, body aches, joint swelling, chest pain, shortness of breath, mood changes. POSITIVE muscle aches  Objective  Blood pressure 118/88, pulse 86, height  (1.651 m), weight 165 lb (74.8 kg), SpO2 98 %.   General: No apparent distress alert and oriented x3 mood and affect normal, dressed appropriately.  HEENT: Pupils equal, extraocular movements intact  Respiratory: Patient's speak in full sentences and does not appear  short of breath  Cardiovascular: No lower extremity edema, non tender, no erythema  Right hip pain noted but mild, low back does have loss of lordosis  Tightness with FABER  Osteopathic findings C3 flexed rotated and side bent right C6 flexed rotated and side bent left T3 extended rotated and side bent right inhaled third rib T8 extended rotated and side bent left L3 flexed rotated and side bent left  Sacrum right on right    Impression and Recommendations:     The above documentation has been reviewed and is accurate and complete Judi Saa, DO

## 2022-06-23 ENCOUNTER — Ambulatory Visit: Payer: 59 | Admitting: Family Medicine

## 2022-06-23 ENCOUNTER — Encounter: Payer: Self-pay | Admitting: Family Medicine

## 2022-06-23 VITALS — BP 118/88 | HR 86 | Ht 65.0 in | Wt 165.0 lb

## 2022-06-23 DIAGNOSIS — M5416 Radiculopathy, lumbar region: Secondary | ICD-10-CM | POA: Diagnosis not present

## 2022-06-23 DIAGNOSIS — M9908 Segmental and somatic dysfunction of rib cage: Secondary | ICD-10-CM | POA: Diagnosis not present

## 2022-06-23 DIAGNOSIS — M9902 Segmental and somatic dysfunction of thoracic region: Secondary | ICD-10-CM

## 2022-06-23 DIAGNOSIS — M9904 Segmental and somatic dysfunction of sacral region: Secondary | ICD-10-CM

## 2022-06-23 DIAGNOSIS — M9903 Segmental and somatic dysfunction of lumbar region: Secondary | ICD-10-CM | POA: Diagnosis not present

## 2022-06-23 DIAGNOSIS — M48062 Spinal stenosis, lumbar region with neurogenic claudication: Secondary | ICD-10-CM | POA: Diagnosis not present

## 2022-06-23 DIAGNOSIS — M9901 Segmental and somatic dysfunction of cervical region: Secondary | ICD-10-CM | POA: Diagnosis not present

## 2022-06-23 NOTE — Assessment & Plan Note (Signed)
Worsening L4-L5.  Likely contributing to most of the discomfort and pain.  Do feel that the epidural would be beneficial.  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  Did respond somewhat to osteopathic manipulation.  Continue to take the Effexor.  Follow-up again in 6 weeks after the epidural

## 2022-06-23 NOTE — Patient Instructions (Addendum)
So so happy effexor is doing well  We will order the epidural at L5/S1 to turn it down  See me again in 5-6 weeks or when ever you need to go shopping

## 2022-07-11 ENCOUNTER — Inpatient Hospital Stay: Admission: RE | Admit: 2022-07-11 | Payer: 59 | Source: Ambulatory Visit

## 2022-07-19 ENCOUNTER — Ambulatory Visit
Admission: RE | Admit: 2022-07-19 | Discharge: 2022-07-19 | Disposition: A | Discharge status: Home / Self Care | Payer: 59 | Source: Ambulatory Visit | Attending: Family Medicine | Admitting: Family Medicine

## 2022-07-19 DIAGNOSIS — M5431 Sciatica, right side: Secondary | ICD-10-CM | POA: Diagnosis not present

## 2022-07-19 DIAGNOSIS — M5416 Radiculopathy, lumbar region: Secondary | ICD-10-CM

## 2022-07-19 DIAGNOSIS — M545 Low back pain, unspecified: Secondary | ICD-10-CM | POA: Diagnosis not present

## 2022-07-19 DIAGNOSIS — M47817 Spondylosis without myelopathy or radiculopathy, lumbosacral region: Secondary | ICD-10-CM | POA: Diagnosis not present

## 2022-07-19 MED ORDER — IOPAMIDOL (ISOVUE-M 200) INJECTION 41%
1.0000 mL | Freq: Once | INTRAMUSCULAR | Status: AC
  Administered 2022-07-19: 1 mL via EPIDURAL

## 2022-07-19 MED ORDER — METHYLPREDNISOLONE ACETATE 40 MG/ML INJ SUSP (RADIOLOG
80.0000 mg | Freq: Once | INTRAMUSCULAR | Status: AC
  Administered 2022-07-19: 80 mg via EPIDURAL

## 2022-07-19 NOTE — Discharge Instructions (Signed)

## 2022-07-27 NOTE — Progress Notes (Signed)
  [facility] [address] [place] [address] Phone: [phone] Subjective:   Bruce Donath, am serving as a scribe for Dr. Antoine Primas.  I'm seeing this patient by the request  of:  Aileen Fass, PA-C  CC: Back and neck pain follow-up  NGE:[id number]  Vanessa Kick Cloie Kitchen is a 57 y.o. female coming in with complaint of back and neck pain. OMT 06/23/2022. Patient states that she had epidural on 07/19/2022. Had no pain after injection but did lift something to aggreavte her lower back. Does have some numbness in R foot but her pain is not as bad as it was last visit.  Feels like she is making significant progress at the moment.  Medications patient has been prescribed: Effexor  Taking: Yes and has been very helpful         Reviewed prior external information including notes and imaging from previsou exam, outside providers and external EMR if available.   As well as notes that were available from care everywhere and other healthcare systems.  Past medical history, social, surgical and family history all reviewed in electronic medical record.  No pertanent information unless stated regarding to the chief complaint.   Past Medical History:  Diagnosis Date   Anxiety    Arthritis    neck   Attention deficit disorder    Dyspareunia    Dysrhythmia    ..this was 17 yrs ago and was seen Dr Clifton James   GERD (gastroesophageal reflux disease)    Headache(784.0)    due to neck - otc meds prn   Herpes simplex type 1 infection    PONV (postoperative nausea and vomiting)    Seasonal allergies    Sleep apnea    cpap   SVD (spontaneous vaginal delivery)    x 2   Urinary incontinence     Allergies  Allergen Reactions   Other Swelling    Pt states she had previous reaction to a patch for nausea.  Pt states that it caused swelling   Adhesive [Tape] Rash   Latex Rash    Objective  Blood pressure 118/80, pulse (!) 104, height 5\' 5"   (1.651 m), SpO2 95 %.   General: No apparent distress alert and oriented x3 mood and affect normal, dressed appropriately.  HEENT: Pupils equal, extraocular movements intact  Respiratory: Patient's speak in full sentences and does not appear short of breath  Cardiovascular: No lower extremity edema, non tender, no erythema  Low back exam does have some mild loss of lordosis but is sitting comfortably.  Neck exam also has some limited sidebending bilaterally but also seems to be comfortable        Assessment and Plan:  Spinal stenosis of lumbar region with neurogenic claudication Significant improvement after the epidural.  Can repeat as necessary at this time.  The patient has done extremely well with the weight loss and I think that this is going to contribute to improvement as well.  Follow-up again in 6 to 8 weeks.          The above documentation has been reviewed and is accurate and complete Judi Saa, DO        Note: This dictation was prepared with Dragon dictation along with smaller phrase technology. Any transcriptional errors that result from this process are unintentional.

## 2022-07-28 ENCOUNTER — Other Ambulatory Visit: Payer: Self-pay

## 2022-07-28 ENCOUNTER — Ambulatory Visit: Payer: 59 | Admitting: Family Medicine

## 2022-07-28 VITALS — BP 118/80 | HR 104 | Ht 65.0 in

## 2022-07-28 DIAGNOSIS — M48062 Spinal stenosis, lumbar region with neurogenic claudication: Secondary | ICD-10-CM

## 2022-07-28 MED ORDER — VENLAFAXINE HCL ER 75 MG PO CP24: 75.0000 mg | ORAL_CAPSULE | Freq: Every day | ORAL | 1 refills | Status: DC

## 2022-07-28 NOTE — Assessment & Plan Note (Signed)
Significant improvement after the epidural.  Can repeat as necessary at this time.  The patient has done extremely well with the weight loss and I think that this is going to contribute to improvement as well.  Follow-up again in 6 to 8 weeks.

## 2022-07-28 NOTE — Patient Instructions (Signed)
Effexor 90 supply Glad epidural is doing well

## 2022-10-27 ENCOUNTER — Ambulatory Visit: Payer: 59 | Admitting: Primary Care

## 2022-10-27 ENCOUNTER — Other Ambulatory Visit: Payer: Self-pay | Admitting: Family Medicine

## 2022-10-27 ENCOUNTER — Encounter: Payer: Self-pay | Admitting: Primary Care

## 2022-10-27 VITALS — BP 128/64 | HR 145 | Temp 98.4°F | Ht 65.0 in | Wt 156.6 lb

## 2022-10-27 DIAGNOSIS — G4733 Obstructive sleep apnea (adult) (pediatric): Secondary | ICD-10-CM | POA: Diagnosis not present

## 2022-10-27 NOTE — Progress Notes (Signed)
@Patient  ID: Kaitlyn Carter, female    DOB: Nov 30, 1965, 57 y.o.   MRN: [id number]  Chief Complaint  Patient presents with   Follow-up    CPAP f/u-still waking up gasping at times even while wearing CPAP    Referring provider: Lynett Fish Christin*  HPI: 57 year old female. Never smoked. PMH significant for OSA, asthmatic bronchitis, reactive airway disease, seasonal allergies, GERD, ADD, depression, GAD, s/p hysterectomy.   Previous [facility] pulmonary encounter:  06/01/2022 Patient presents today for overdue OSA follow-up. Previous patient of Dr. Vassie Loll, last seen in our office by pulmonary NP on 11/12/2019. HST on 01/13/2015 showed mild OSA, AHI 13.4/hour.   She reports continued compliance with CPAP, download not available d/t 3G cut off. She recently went on a cruise and family friend who is a Garment/textile technologist told her she stops breathing in her sleep. She will wake up gasping/choking despite compliance with CPAP. She uses full face mask. She sleeps on her side primarily. She is requiring naps throughout the day. CPAP machine is 55 years old   Sleep questionnaire Symptoms-  snoring, gasping/choking and witnessed apnea  Prior sleep study- 2016 Bedtime- 11pm Time to fall asleep- <5 mins  Nocturnal awakenings- several times Out of bed/start of day- 9am  Weight changes- +/- 20 lbs  Do you operate heavy machinery- no Do you currently wear CPAP- yes, auto 5-15cm h20  Do you current wear oxygen- no  Epworth- 13 Medication - adderall 30mg  and Effexor 75mg    OSA (obstructive sleep apnea) -  HST on 01/13/2015 showed mild OSA, AHI 13.4/hour. Maintained on auto CPAP 5-15cm h20. She reports compliance with use, airview download unavailable d/t 3G cut off. She continues to have witnessed apnea and reports waking up gasping/choking at night despite wearing her CPAP at night. She requiring daily naps. Needs in-lab CPAP titration study to assess optimal pressure setting to eliminate apneas.  Encourage patient get a wedge pillow to elevate her head at night 30 degrees. Advised she avoid alcohol/sedative prior to bedtime and cautioned against driving if experiencing excessive daytime sleepiness. Follow-up by telephone after sleep study and in 3 months for compliance check.   10/27/2022- Interim hx Patient presents today for a 67-month follow-up.  Home sleep study on 01/13/2015 showed mild obstructive sleep apnea, AHI 13.4 an hour. Maintained on auto CPAP 5 to 15 cm H2O. She was seen back in April and complained of continued periods of apnea and waking up gasping/choking at night despite wearing CPAP.  She was ordered for CPAP titration study which does not appear to have been completed. We encouraged she try using a wedge pillow to elevate her head at night while sleeping.   Airview download 07/29/22-10/26/22 Usage 30/30 days (33%) > 4 hours Pressure 5-15cm h20 (10.2cm h20-95%) Airleaks 1.1L/min (95%) AHI 3.1   Allergies  Allergen Reactions   Other Swelling    Pt states she had previous reaction to a patch for nausea.  Pt states that it caused swelling   Adhesive [Tape] Rash   Latex Rash    Immunization History  Administered Date(s) Administered   Influenza,inj,Quad PF,6+ Mos 11/12/2019   PFIZER(Purple Top)SARS-COV-2 Vaccination 09/26/2019, 10/17/2019    Past Medical History:  Diagnosis Date   Anxiety    Arthritis    neck   Attention deficit disorder    Dyspareunia    Dysrhythmia    ..this was 17 yrs ago and was seen Dr Clifton James   GERD (gastroesophageal reflux disease)  Headache(784.0)    due to neck - otc meds prn   Herpes simplex type 1 infection    PONV (postoperative nausea and vomiting)    Seasonal allergies    Sleep apnea    cpap   SVD (spontaneous vaginal delivery)    x 2   Urinary incontinence     Tobacco History: Social History   Tobacco Use  Smoking Status Never  Smokeless Tobacco Never   Counseling given: Not Answered   Outpatient  Medications Prior to Visit  Medication Sig Dispense Refill   venlafaxine XR (EFFEXOR-XR) 75 MG 24 hr capsule TAKE 1 CAPSULE BY MOUTH DAILY WITH BREAKFAST. 90 capsule 2   amphetamine-dextroamphetamine (ADDERALL XR) 30 MG 24 hr capsule Take 1 capsule by mouth daily as needed (ADHD). (Patient not taking: Reported on 10/27/2022)  0   No facility-administered medications prior to visit.   Review of Systems  Review of Systems  Constitutional: Negative.   Respiratory:  Positive for apnea.   Cardiovascular: Negative.   Psychiatric/Behavioral:  Positive for sleep disturbance.    Physical Exam  BP 128/64 (BP Location: Left Arm, Cuff Size: Normal)   Pulse (!) 145   Temp 98.4 F (36.9 C) (Temporal)   Ht 5\' 5"  (1.651 m)   Wt 156 lb 9.6 oz (71 kg)   SpO2 99%   BMI 26.06 kg/m  Physical Exam Constitutional:      Appearance: Normal appearance.  HENT:     Head: Normocephalic and atraumatic.     Mouth/Throat:     Mouth: Mucous membranes are moist.     Pharynx: Oropharynx is clear.     Comments: Elongated uvula  Cardiovascular:     Rate and Rhythm: Normal rate and regular rhythm.  Pulmonary:     Effort: Pulmonary effort is normal.     Breath sounds: Normal breath sounds.  Musculoskeletal:        General: Normal range of motion.  Skin:    General: Skin is warm and dry.  Neurological:     General: No focal deficit present.     Mental Status: She is alert and oriented to person, place, and time. Mental status is at baseline.  Psychiatric:        Mood and Affect: Mood normal.        Behavior: Behavior normal.        Thought Content: Thought content normal.        Judgment: Judgment normal.      Lab Results:  CBC    Component Value Date/Time   WBC 9.8 04/20/2021 1425   RBC 4.82 04/20/2021 1425   HGB 14.8 04/20/2021 1425   HCT 43.9 04/20/2021 1425   PLT 365 04/20/2021 1425   MCV 91.1 04/20/2021 1425   MCH 30.7 04/20/2021 1425   MCHC 33.7 04/20/2021 1425   RDW 12.7 04/20/2021  1425   LYMPHSABS 2.3 04/20/2021 1425   MONOABS 0.7 04/20/2021 1425   EOSABS 0.1 04/20/2021 1425   BASOSABS 0.0 04/20/2021 1425    BMET    Component Value Date/Time   NA 136 04/20/2021 1425   K 4.1 04/20/2021 1425   CL 104 04/20/2021 1425   CO2 23 04/20/2021 1425   GLUCOSE 116 (H) 04/20/2021 1425   BUN 14 04/20/2021 1425   CREATININE 0.74 04/20/2021 1425   CREATININE 0.87 07/06/2016 1631   CALCIUM 9.5 04/20/2021 1425   GFRNONAA >60 04/20/2021 1425   GFRAA >60 03/08/2015 0038    BNP No results found  for: "BNP"  ProBNP No results found for: "PROBNP"  Imaging: No results found.   Assessment & Plan:   OSA (obstructive sleep apnea) - Overall patients sleep apnea is well controlled on auto CPAP, however, she continues to experience episodes of apnea and waking up gasping/choking despite wearing CPAP - Home sleep study on 01/13/2015 showed mild obstructive sleep apnea, AHI 13.4 an hour - Current pressure 5-15cm h20 (10.2cm h20-95%); Residual AHI 3.1 - Recommend changing to set pressure 10cm h20 and advised she look into getting wedge pillow - She may benefit from having ENT for surgical evalution due to elongated uvula seen on exam. Also consider CPAP titration study if still having difficulty with periodic apneas  - Encourage patient be more consistent with wearing CPAP nightly 4-6 hour or longer  - FU in 6 months or sooner if needed      Glenford Bayley, NP 10/27/2022

## 2022-10-27 NOTE — Progress Notes (Signed)
Reviewed and agree with assessment/plan.   Coralyn Helling, MD [facility] Pulmonary/Critical Care 10/27/2022, 12:16 PM Pager:  [phone]

## 2022-10-27 NOTE — Assessment & Plan Note (Addendum)
-   Overall patients sleep apnea is well controlled on auto CPAP, however, she continues to experience episodes of apnea and waking up gasping/choking despite wearing CPAP - Home sleep study on 01/13/2015 showed mild obstructive sleep apnea, AHI 13.4 an hour - Current pressure 5-15cm h20 (10.2cm h20-95%); Residual AHI 3.1 - Recommend changing to set pressure 10cm h20 and advised she look into getting wedge pillow - She may benefit from having ENT for surgical evalution due to elongated uvula seen on exam. Also consider CPAP titration study if still having difficulty with periodic apneas  - Encourage patient be more consistent with wearing CPAP nightly 4-6 hour or longer  - FU in 6 months or sooner if needed

## 2022-10-27 NOTE — Patient Instructions (Addendum)
Sleep apnea is well-controlled on current pressure settings Current pressure auto 5-15.  You are having some mild residual apneas, average 3 events per hour Will change to set pressure of 10 cm H2O and encouraged elevating your head with a wedge pillow Make sure you are wearing your CPAP every night for minimum of 4 or more hours  Recommendations: - Look at getting wedge pillow for side sleepers- one is called medcline - If still having issues waking up gasping/Choking we should either go into sleep lab for CPAP titration study and/or refer you to ENT to see if there is any upper airway obstruction causing symptoms and if you could benefit from surgery   Orders: Change CPAP pressure 10  Follow-up 6 months with [facility] NP or sooner if needed    Sleep Apnea  Sleep apnea is a condition that affects your breathing while you are sleeping. Your tongue or soft tissue in your throat may block the flow of air while you sleep. You may have shallow breathing or stop breathing for short periods of time. People with sleep apnea may snore loudly. There are three kinds of sleep apnea: Obstructive sleep apnea. This kind is caused by a blocked or collapsed airway. This is the most common. Central sleep apnea. This kind happens when the part of the brain that controls breathing does not send the correct signals to the muscles that control breathing. Mixed sleep apnea. This is a combination of obstructive and central sleep apnea. What are the causes? The most common cause of sleep apnea is a collapsed or blocked airway. What increases the risk? Being very overweight. Having family members with sleep apnea. Having a tongue or tonsils that are larger than normal. Having a small airway or jaw problems. Being older. What are the signs or symptoms? Loud snoring. Restless sleep. Trouble staying asleep. Being sleepy or tired during the day. Waking up gasping or choking. Having a headache in the  morning. Mood swings. Having a hard time remembering things and concentrating. How is this diagnosed? A medical history. A physical exam. A sleep study. This is also called a polysomnography test. This test is done at a sleep lab or in your home while you are sleeping. How is this treated? Treatment may include: Sleeping on your side. Losing weight if you're overweight. Wearing an oral appliance. This is a mouthpiece that moves your lower jaw forward. Using a positive airway pressure (PAP) device to keep your airways open while you sleep, such as: A continuous positive airway pressure (CPAP) device. This device gives forced air through a mask when you breathe out. This keeps your airways open. A bilevel positive airway pressure (BIPAP) device. This device gives forced air through a mask when you breathe in and when you breathe out to keep your airways open. Having surgery if other treatments do not work. If your sleep apnea is not treated, you may be at risk for: Heart failure. Heart attack. Stroke. Type 2 diabetes or a problem with your blood sugar called insulin resistance. Follow these instructions at home: Medicines Take your medicines only as told by your health care provider. Avoid alcohol, medicines to help you relax, and certain pain medicines. These may make sleep apnea worse. General instructions Do not smoke, vape, or use products with nicotine or tobacco in them. If you need help quitting, talk with your provider. If you were given a PAP device to open your airway while you sleep, use it as told by your provider.  If you're having surgery, make sure to tell your provider you have sleep apnea. You may need to bring your PAP device with you. Contact a health care provider if: The PAP device that you were given to use during sleep bothers you or does not seem to be working. You do not feel better or you feel worse. Get help right away if: You have trouble breathing. You have  chest pain. You have trouble talking. One side of your body feels weak. A part of your face is hanging down. These symptoms may be an emergency. Call 911 right away. Do not wait to see if the symptoms will go away. Do not drive yourself to the hospital. This information is not intended to replace advice given to you by your health care provider. Make sure you discuss any questions you have with your health care provider. Document Revised: 04/21/2022 Document Reviewed: 04/21/2022 Elsevier Patient Education  2024 [employer].

## 2022-11-22 ENCOUNTER — Encounter: Payer: Self-pay | Admitting: Family Medicine

## 2022-11-22 ENCOUNTER — Other Ambulatory Visit: Payer: Self-pay | Admitting: Family Medicine

## 2022-11-22 DIAGNOSIS — M5416 Radiculopathy, lumbar region: Secondary | ICD-10-CM

## 2022-12-05 NOTE — Telephone Encounter (Signed)
Peer to peer is already set up for today at 1215

## 2022-12-05 NOTE — Telephone Encounter (Signed)
Peer to peer scheduled today (10/7) at 12:15pm.

## 2022-12-05 NOTE — Telephone Encounter (Signed)
Patient called stating that her insurance has denied the epidural injection and she asked if we would be able to do a Peer to peer? I don't believe we have received anything from [facility] yet.

## 2022-12-20 NOTE — Telephone Encounter (Signed)
Patient called to find out if there was determination after the peer to peer?

## 2022-12-20 NOTE — Telephone Encounter (Signed)
Left message informing patient and gave number to call for [facility].

## 2022-12-26 NOTE — Discharge Instructions (Signed)

## 2022-12-27 ENCOUNTER — Ambulatory Visit
Admission: RE | Admit: 2022-12-27 | Discharge: 2022-12-27 | Disposition: A | Discharge status: Home / Self Care | Payer: 59 | Source: Ambulatory Visit | Attending: Family Medicine | Admitting: Family Medicine

## 2022-12-27 DIAGNOSIS — M5417 Radiculopathy, lumbosacral region: Secondary | ICD-10-CM | POA: Diagnosis not present

## 2022-12-27 DIAGNOSIS — M5416 Radiculopathy, lumbar region: Secondary | ICD-10-CM

## 2022-12-27 MED ORDER — METHYLPREDNISOLONE ACETATE 40 MG/ML INJ SUSP (RADIOLOG
80.0000 mg | Freq: Once | INTRAMUSCULAR | Status: AC
  Administered 2022-12-27: 80 mg via EPIDURAL

## 2022-12-27 MED ORDER — IOPAMIDOL (ISOVUE-M 200) INJECTION 41%
1.0000 mL | Freq: Once | INTRAMUSCULAR | Status: AC
  Administered 2022-12-27: 1 mL via EPIDURAL

## 2023-04-28 ENCOUNTER — Ambulatory Visit: Payer: 59 | Admitting: Primary Care

## 2023-04-30 ENCOUNTER — Other Ambulatory Visit: Payer: Self-pay | Admitting: Family Medicine
